# Patient Record
Sex: Male | Born: 1965 | Hispanic: No | Marital: Married | Smoking: Former smoker
Health system: Southern US, Community
[De-identification: ages and names within clinical notes are randomized; demographics above are authoritative.]

## PROBLEM LIST (undated history)

## (undated) DIAGNOSIS — E1165 Type 2 diabetes mellitus with hyperglycemia: Secondary | ICD-10-CM

## (undated) DIAGNOSIS — E669 Obesity, unspecified: Secondary | ICD-10-CM

## (undated) DIAGNOSIS — E781 Pure hyperglyceridemia: Secondary | ICD-10-CM

## (undated) DIAGNOSIS — I4891 Unspecified atrial fibrillation: Secondary | ICD-10-CM

## (undated) DIAGNOSIS — R7303 Prediabetes: Secondary | ICD-10-CM

## (undated) DIAGNOSIS — K760 Fatty (change of) liver, not elsewhere classified: Secondary | ICD-10-CM

## (undated) DIAGNOSIS — F419 Anxiety disorder, unspecified: Secondary | ICD-10-CM

## (undated) DIAGNOSIS — I201 Angina pectoris with documented spasm: Secondary | ICD-10-CM

## (undated) DIAGNOSIS — IMO0002 Reserved for concepts with insufficient information to code with codable children: Secondary | ICD-10-CM

## (undated) DIAGNOSIS — E559 Vitamin D deficiency, unspecified: Secondary | ICD-10-CM

## (undated) DIAGNOSIS — F329 Major depressive disorder, single episode, unspecified: Secondary | ICD-10-CM

## (undated) DIAGNOSIS — K589 Irritable bowel syndrome without diarrhea: Secondary | ICD-10-CM

## (undated) DIAGNOSIS — R1032 Left lower quadrant pain: Secondary | ICD-10-CM

## (undated) DIAGNOSIS — R1013 Epigastric pain: Secondary | ICD-10-CM

## (undated) DIAGNOSIS — R1312 Dysphagia, oropharyngeal phase: Secondary | ICD-10-CM

## (undated) DIAGNOSIS — E7800 Pure hypercholesterolemia, unspecified: Secondary | ICD-10-CM

## (undated) DIAGNOSIS — K219 Gastro-esophageal reflux disease without esophagitis: Secondary | ICD-10-CM

## (undated) DIAGNOSIS — E291 Testicular hypofunction: Secondary | ICD-10-CM

## (undated) DIAGNOSIS — F32A Depression, unspecified: Secondary | ICD-10-CM

## (undated) DIAGNOSIS — E785 Hyperlipidemia, unspecified: Secondary | ICD-10-CM

## (undated) DIAGNOSIS — R079 Chest pain, unspecified: Secondary | ICD-10-CM

## (undated) DIAGNOSIS — F5104 Psychophysiologic insomnia: Secondary | ICD-10-CM

## (undated) HISTORY — DX: Fatty (change of) liver, not elsewhere classified: K76.0

## (undated) HISTORY — DX: Depression, unspecified: F32.A

## (undated) HISTORY — DX: Epigastric pain: R10.13

## (undated) HISTORY — DX: Irritable bowel syndrome, unspecified: K58.9

## (undated) HISTORY — DX: Type 2 diabetes mellitus with hyperglycemia: E11.65

## (undated) HISTORY — DX: Angina pectoris with documented spasm: I20.1

## (undated) HISTORY — DX: Psychophysiologic insomnia: F51.04

## (undated) HISTORY — DX: Pure hypercholesterolemia, unspecified: E78.00

## (undated) HISTORY — DX: Reserved for concepts with insufficient information to code with codable children: IMO0002

## (undated) HISTORY — DX: Major depressive disorder, single episode, unspecified: F32.9

## (undated) HISTORY — DX: Left lower quadrant pain: R10.32

## (undated) HISTORY — DX: Dysphagia, oropharyngeal phase: R13.12

## (undated) HISTORY — DX: Vitamin D deficiency, unspecified: E55.9

## (undated) HISTORY — PX: KNEE SURGERY: SHX244

## (undated) HISTORY — DX: Testicular hypofunction: E29.1

---

## 1983-03-21 HISTORY — PX: APPENDECTOMY: SHX54

## 1988-03-20 HISTORY — PX: EXTERNAL EAR SURGERY: SHX627

## 2001-07-18 HISTORY — PX: COLONOSCOPY: SHX174

## 2004-03-20 HISTORY — PX: UPPER GI ENDOSCOPY: SHX6162

## 2004-12-15 ENCOUNTER — Encounter: Admission: RE | Admit: 2004-12-15 | Discharge: 2004-12-15 | Payer: Self-pay | Admitting: Unknown Physician Specialty

## 2005-04-22 ENCOUNTER — Encounter: Admission: RE | Admit: 2005-04-22 | Discharge: 2005-04-22 | Payer: Self-pay | Admitting: Unknown Physician Specialty

## 2005-04-30 ENCOUNTER — Encounter: Admission: RE | Admit: 2005-04-30 | Discharge: 2005-04-30 | Payer: Self-pay | Admitting: Unknown Physician Specialty

## 2005-07-18 HISTORY — PX: COLONOSCOPY: SHX174

## 2006-04-24 HISTORY — PX: HEMORROIDECTOMY: SUR656

## 2008-03-20 HISTORY — PX: KNEE SURGERY: SHX244

## 2009-03-02 ENCOUNTER — Observation Stay (HOSPITAL_COMMUNITY): Admission: EM | Admit: 2009-03-02 | Discharge: 2009-03-05 | Payer: Self-pay | Admitting: Emergency Medicine

## 2009-11-18 ENCOUNTER — Encounter: Admission: RE | Admit: 2009-11-18 | Discharge: 2009-11-18 | Payer: Self-pay | Admitting: Orthopedic Surgery

## 2010-04-09 ENCOUNTER — Encounter: Payer: Self-pay | Admitting: Unknown Physician Specialty

## 2010-04-10 ENCOUNTER — Encounter: Payer: Self-pay | Admitting: Orthopedic Surgery

## 2010-06-20 LAB — BASIC METABOLIC PANEL
CO2: 29 mEq/L (ref 19–32)
Calcium: 8.9 mg/dL (ref 8.4–10.5)
Creatinine, Ser: 0.91 mg/dL (ref 0.4–1.5)

## 2010-06-21 LAB — CBC
MCHC: 35.1 g/dL (ref 30.0–36.0)
Platelets: 177 10*3/uL (ref 150–400)
RBC: 5.15 MIL/uL (ref 4.22–5.81)
RDW: 14.7 % (ref 11.5–15.5)

## 2010-06-21 LAB — URINALYSIS, ROUTINE W REFLEX MICROSCOPIC
Bilirubin Urine: NEGATIVE
Hgb urine dipstick: NEGATIVE
Ketones, ur: NEGATIVE mg/dL
Protein, ur: NEGATIVE mg/dL
Urobilinogen, UA: 1 mg/dL (ref 0.0–1.0)

## 2010-06-21 LAB — CK TOTAL AND CKMB (NOT AT ARMC)
CK, MB: 1.9 ng/mL (ref 0.3–4.0)
CK, MB: 2.6 ng/mL (ref 0.3–4.0)
Total CK: 211 U/L (ref 7–232)
Total CK: 217 U/L (ref 7–232)
Total CK: 252 U/L — ABNORMAL HIGH (ref 7–232)

## 2010-06-21 LAB — DIFFERENTIAL
Basophils Absolute: 0.1 10*3/uL (ref 0.0–0.1)
Basophils Relative: 1 % (ref 0–1)
Eosinophils Absolute: 0.3 10*3/uL (ref 0.0–0.7)
Eosinophils Relative: 3 % (ref 0–5)
Monocytes Absolute: 0.7 10*3/uL (ref 0.1–1.0)
Neutro Abs: 5.9 10*3/uL (ref 1.7–7.7)

## 2010-06-21 LAB — LIPID PANEL
HDL: 35 mg/dL — ABNORMAL LOW (ref >39)
Total CHOL/HDL Ratio: 5.1 RATIO
VLDL: 60 mg/dL — ABNORMAL HIGH (ref 0–40)

## 2010-06-21 LAB — BASIC METABOLIC PANEL
Calcium: 8.9 mg/dL (ref 8.4–10.5)
GFR calc non Af Amer: >60 mL/min (ref >60)
Glucose, Bld: 105 mg/dL — ABNORMAL HIGH (ref 70–99)
Sodium: 133 mEq/L — ABNORMAL LOW (ref 135–145)

## 2010-06-21 LAB — POCT CARDIAC MARKERS
CKMB, poc: 1.2 ng/mL (ref 1.0–8.0)
Myoglobin, poc: 108 ng/mL (ref 12–200)

## 2013-06-07 ENCOUNTER — Inpatient Hospital Stay (HOSPITAL_COMMUNITY)
Admission: EM | Admit: 2013-06-07 | Discharge: 2013-06-09 | DRG: 311 | Disposition: A | Discharge status: Home / Self Care | Payer: BC Managed Care – PPO | Attending: Internal Medicine | Admitting: Internal Medicine

## 2013-06-07 ENCOUNTER — Encounter (HOSPITAL_COMMUNITY): Payer: Self-pay | Admitting: Emergency Medicine

## 2013-06-07 ENCOUNTER — Emergency Department (HOSPITAL_COMMUNITY): Payer: BC Managed Care – PPO

## 2013-06-07 DIAGNOSIS — I4729 Other ventricular tachycardia: Secondary | ICD-10-CM | POA: Diagnosis not present

## 2013-06-07 DIAGNOSIS — E782 Mixed hyperlipidemia: Secondary | ICD-10-CM

## 2013-06-07 DIAGNOSIS — K219 Gastro-esophageal reflux disease without esophagitis: Secondary | ICD-10-CM | POA: Diagnosis present

## 2013-06-07 DIAGNOSIS — Z8249 Family history of ischemic heart disease and other diseases of the circulatory system: Secondary | ICD-10-CM

## 2013-06-07 DIAGNOSIS — Z87891 Personal history of nicotine dependence: Secondary | ICD-10-CM

## 2013-06-07 DIAGNOSIS — I472 Ventricular tachycardia, unspecified: Secondary | ICD-10-CM | POA: Diagnosis not present

## 2013-06-07 DIAGNOSIS — E119 Type 2 diabetes mellitus without complications: Secondary | ICD-10-CM | POA: Diagnosis present

## 2013-06-07 DIAGNOSIS — E781 Pure hyperglyceridemia: Secondary | ICD-10-CM | POA: Diagnosis present

## 2013-06-07 DIAGNOSIS — E8881 Metabolic syndrome: Secondary | ICD-10-CM | POA: Diagnosis present

## 2013-06-07 DIAGNOSIS — R079 Chest pain, unspecified: Secondary | ICD-10-CM

## 2013-06-07 DIAGNOSIS — R7309 Other abnormal glucose: Secondary | ICD-10-CM

## 2013-06-07 DIAGNOSIS — Z7982 Long term (current) use of aspirin: Secondary | ICD-10-CM

## 2013-06-07 DIAGNOSIS — E669 Obesity, unspecified: Secondary | ICD-10-CM | POA: Diagnosis present

## 2013-06-07 DIAGNOSIS — I201 Angina pectoris with documented spasm: Principal | ICD-10-CM | POA: Diagnosis present

## 2013-06-07 DIAGNOSIS — Z6835 Body mass index (BMI) 35.0-35.9, adult: Secondary | ICD-10-CM

## 2013-06-07 DIAGNOSIS — E7800 Pure hypercholesterolemia, unspecified: Secondary | ICD-10-CM | POA: Diagnosis present

## 2013-06-07 DIAGNOSIS — F411 Generalized anxiety disorder: Secondary | ICD-10-CM | POA: Diagnosis present

## 2013-06-07 DIAGNOSIS — E785 Hyperlipidemia, unspecified: Secondary | ICD-10-CM | POA: Diagnosis present

## 2013-06-07 DIAGNOSIS — I2510 Atherosclerotic heart disease of native coronary artery without angina pectoris: Secondary | ICD-10-CM | POA: Diagnosis present

## 2013-06-07 HISTORY — DX: Unspecified atrial fibrillation: I48.91

## 2013-06-07 HISTORY — DX: Chest pain, unspecified: R07.9

## 2013-06-07 HISTORY — DX: Obesity, unspecified: E66.9

## 2013-06-07 HISTORY — DX: Pure hyperglyceridemia: E78.1

## 2013-06-07 HISTORY — DX: Gastro-esophageal reflux disease without esophagitis: K21.9

## 2013-06-07 HISTORY — DX: Anxiety disorder, unspecified: F41.9

## 2013-06-07 HISTORY — DX: Prediabetes: R73.03

## 2013-06-07 HISTORY — DX: Angina pectoris with documented spasm: I20.1

## 2013-06-07 HISTORY — DX: Hyperlipidemia, unspecified: E78.5

## 2013-06-07 LAB — I-STAT CHEM 8, ED
BUN: 10 mg/dL (ref 6–23)
CALCIUM ION: 1.11 mmol/L — AB (ref 1.12–1.23)
CHLORIDE: 100 meq/L (ref 96–112)
Creatinine, Ser: 1.1 mg/dL (ref 0.50–1.35)
Glucose, Bld: 111 mg/dL — ABNORMAL HIGH (ref 70–99)
HEMATOCRIT: 47 % (ref 39.0–52.0)
Hemoglobin: 16 g/dL (ref 13.0–17.0)
Potassium: 4.3 mEq/L (ref 3.7–5.3)
SODIUM: 139 meq/L (ref 137–147)
TCO2: 28 mmol/L (ref 0–100)

## 2013-06-07 LAB — MRSA PCR SCREENING: MRSA by PCR: NEGATIVE

## 2013-06-07 LAB — CBC
HEMATOCRIT: 43.9 % (ref 39.0–52.0)
HEMATOCRIT: 40.5 % (ref 39.0–52.0)
HEMOGLOBIN: 14.3 g/dL (ref 13.0–17.0)
Hemoglobin: 15.3 g/dL (ref 13.0–17.0)
MCH: 30.5 pg (ref 26.0–34.0)
MCH: 30.8 pg (ref 26.0–34.0)
MCHC: 34.9 g/dL (ref 30.0–36.0)
MCHC: 35.3 g/dL (ref 30.0–36.0)
MCV: 87.5 fL (ref 78.0–100.0)
MCV: 87.3 fL (ref 78.0–100.0)
PLATELETS: 184 10*3/uL (ref 150–400)
PLATELETS: 166 10*3/uL (ref 150–400)
RBC: 5.02 MIL/uL (ref 4.22–5.81)
RBC: 4.64 MIL/uL (ref 4.22–5.81)
RDW: 13.4 % (ref 11.5–15.5)
RDW: 13.2 % (ref 11.5–15.5)
WBC: 8.5 10*3/uL (ref 4.0–10.5)
WBC: 8.3 10*3/uL (ref 4.0–10.5)

## 2013-06-07 LAB — I-STAT TROPONIN, ED
TROPONIN I, POC: 0 ng/mL (ref 0.00–0.08)
Troponin i, poc: 0 ng/mL (ref 0.00–0.08)

## 2013-06-07 LAB — COMPREHENSIVE METABOLIC PANEL
ALT: 35 U/L (ref 0–53)
AST: 35 U/L (ref 0–37)
Albumin: 3.6 g/dL (ref 3.5–5.2)
Alkaline Phosphatase: 71 U/L (ref 39–117)
BILIRUBIN TOTAL: 0.3 mg/dL (ref 0.3–1.2)
BUN: 13 mg/dL (ref 6–23)
CO2: 22 meq/L (ref 19–32)
CREATININE: 0.78 mg/dL (ref 0.50–1.35)
Calcium: 8.6 mg/dL (ref 8.4–10.5)
Chloride: 94 mEq/L — ABNORMAL LOW (ref 96–112)
GFR calc Af Amer: >90 mL/min (ref >90)
GLUCOSE: 183 mg/dL — AB (ref 70–99)
Potassium: 4.3 mEq/L (ref 3.7–5.3)
Sodium: 133 mEq/L — ABNORMAL LOW (ref 137–147)
Total Protein: 7 g/dL (ref 6.0–8.3)

## 2013-06-07 LAB — TSH: TSH: 2.558 u[IU]/mL (ref 0.350–4.500)

## 2013-06-07 LAB — RAPID URINE DRUG SCREEN, HOSP PERFORMED
Amphetamines: NOT DETECTED
BENZODIAZEPINES: NOT DETECTED
Barbiturates: NOT DETECTED
Cocaine: NOT DETECTED
OPIATES: POSITIVE — AB
Tetrahydrocannabinol: NOT DETECTED

## 2013-06-07 LAB — TROPONIN I: Troponin I: <0.3 ng/mL (ref <0.30)

## 2013-06-07 LAB — CREATININE, SERUM
Creatinine, Ser: 0.76 mg/dL (ref 0.50–1.35)
GFR calc Af Amer: >90 mL/min (ref >90)
GFR calc non Af Amer: >90 mL/min (ref >90)

## 2013-06-07 MED ORDER — NITROGLYCERIN 0.4 MG SL SUBL
0.4000 mg | SUBLINGUAL_TABLET | SUBLINGUAL | Status: DC | PRN
  Administered 2013-06-07 (×2): 0.4 mg via SUBLINGUAL
  Filled 2013-06-07: qty 1

## 2013-06-07 MED ORDER — SODIUM CHLORIDE 0.9 % IJ SOLN
3.0000 mL | Freq: Two times a day (BID) | INTRAMUSCULAR | Status: DC
  Administered 2013-06-07 – 2013-06-09 (×4): 3 mL via INTRAVENOUS

## 2013-06-07 MED ORDER — HEPARIN SODIUM (PORCINE) 5000 UNIT/ML IJ SOLN
5000.0000 [IU] | Freq: Three times a day (TID) | INTRAMUSCULAR | Status: DC
  Administered 2013-06-07: 5000 [IU] via SUBCUTANEOUS
  Filled 2013-06-07 (×3): qty 1

## 2013-06-07 MED ORDER — ESCITALOPRAM OXALATE 20 MG PO TABS
20.0000 mg | ORAL_TABLET | Freq: Every day | ORAL | Status: DC
  Administered 2013-06-07 – 2013-06-09 (×3): 20 mg via ORAL
  Filled 2013-06-07 (×3): qty 1

## 2013-06-07 MED ORDER — ASPIRIN 81 MG PO CHEW: 324.0000 mg | CHEWABLE_TABLET | ORAL | Status: DC

## 2013-06-07 MED ORDER — ASPIRIN 81 MG PO CHEW
324.0000 mg | CHEWABLE_TABLET | Freq: Once | ORAL | Status: AC
  Administered 2013-06-07: 324 mg via ORAL
  Filled 2013-06-07: qty 4

## 2013-06-07 MED ORDER — NITROGLYCERIN 0.4 MG SL SUBL: 0.4000 mg | SUBLINGUAL_TABLET | SUBLINGUAL | Status: DC | PRN

## 2013-06-07 MED ORDER — ASPIRIN 81 MG PO CHEW
81.0000 mg | CHEWABLE_TABLET | Freq: Every day | ORAL | Status: DC
  Administered 2013-06-08 – 2013-06-09 (×2): 81 mg via ORAL
  Filled 2013-06-07 (×2): qty 1

## 2013-06-07 MED ORDER — MORPHINE SULFATE 4 MG/ML IJ SOLN
4.0000 mg | Freq: Once | INTRAMUSCULAR | Status: AC
  Administered 2013-06-07: 4 mg via INTRAVENOUS
  Filled 2013-06-07: qty 1

## 2013-06-07 MED ORDER — SODIUM CHLORIDE 0.9 % IV SOLN: 250.0000 mL | INTRAVENOUS | Status: DC | PRN

## 2013-06-07 MED ORDER — AMLODIPINE BESYLATE 2.5 MG PO TABS
2.5000 mg | ORAL_TABLET | Freq: Every day | ORAL | Status: DC
  Administered 2013-06-07 – 2013-06-08 (×2): 2.5 mg via ORAL
  Filled 2013-06-07 (×2): qty 1

## 2013-06-07 MED ORDER — ONDANSETRON HCL 4 MG/2ML IJ SOLN
4.0000 mg | Freq: Once | INTRAMUSCULAR | Status: AC
  Administered 2013-06-07: 4 mg via INTRAVENOUS
  Filled 2013-06-07: qty 2

## 2013-06-07 MED ORDER — SODIUM CHLORIDE 0.9 % IJ SOLN
3.0000 mL | INTRAMUSCULAR | Status: DC | PRN
  Administered 2013-06-07: 3 mL via INTRAVENOUS

## 2013-06-07 MED ORDER — ADULT MULTIVITAMIN W/MINERALS CH
1.0000 | ORAL_TABLET | Freq: Every day | ORAL | Status: DC
  Administered 2013-06-07 – 2013-06-09 (×3): 1 via ORAL
  Filled 2013-06-07 (×3): qty 1

## 2013-06-07 MED ORDER — ASPIRIN 81 MG PO CHEW: 81.0000 mg | CHEWABLE_TABLET | Freq: Once | ORAL | Status: DC

## 2013-06-07 MED ORDER — ALPRAZOLAM 0.25 MG PO TABS
0.2500 mg | ORAL_TABLET | ORAL | Status: DC | PRN
  Administered 2013-06-08 – 2013-06-09 (×3): 0.25 mg via ORAL
  Filled 2013-06-07 (×3): qty 1

## 2013-06-07 MED ORDER — ATORVASTATIN CALCIUM 20 MG PO TABS
20.0000 mg | ORAL_TABLET | Freq: Every day | ORAL | Status: DC
  Administered 2013-06-07 – 2013-06-08 (×2): 20 mg via ORAL
  Filled 2013-06-07 (×5): qty 1

## 2013-06-07 MED ORDER — ASPIRIN 81 MG PO CHEW: 324.0000 mg | CHEWABLE_TABLET | Freq: Every day | ORAL | Status: DC

## 2013-06-07 MED ORDER — MORPHINE SULFATE 2 MG/ML IJ SOLN
1.0000 mg | INTRAMUSCULAR | Status: DC | PRN
  Administered 2013-06-07 – 2013-06-08 (×4): 1 mg via INTRAVENOUS
  Filled 2013-06-07 (×4): qty 1

## 2013-06-07 MED ORDER — PANTOPRAZOLE SODIUM 40 MG PO TBEC
40.0000 mg | DELAYED_RELEASE_TABLET | Freq: Every day | ORAL | Status: DC
  Administered 2013-06-07 – 2013-06-09 (×3): 40 mg via ORAL
  Filled 2013-06-07 (×3): qty 1

## 2013-06-07 MED ORDER — HEPARIN SODIUM (PORCINE) 5000 UNIT/ML IJ SOLN
5000.0000 [IU] | Freq: Three times a day (TID) | INTRAMUSCULAR | Status: DC
  Administered 2013-06-07 – 2013-06-09 (×5): 5000 [IU] via SUBCUTANEOUS
  Filled 2013-06-07 (×8): qty 1

## 2013-06-07 MED ORDER — ASPIRIN 300 MG RE SUPP: 300.0000 mg | RECTAL | Status: DC

## 2013-06-07 NOTE — ED Notes (Signed)
C/o sharp L sided chest pain x 3 hours with sob, nausea, and radiation to L shoulder.

## 2013-06-07 NOTE — ED Notes (Signed)
Pt sleeping intermittently.  C/o chest pain

## 2013-06-07 NOTE — H&P (Signed)
Date: 06/07/2013               Patient Name:  Dwayne Silva MRN: [id number]  DOB: November 26, 1965 Age / Sex: 48 y.o., male   PCP: Angelina Sheriff, MD         Medical Service: Internal Medicine Teaching Service         Attending Physician: Dr. Bartholomew Crews, MD    First Contact: Dr. Lesly Dukes Pager: [phone]  Second Contact: Dr. Randell Loop Pager: [phone]       After Hours (After 5p/  First Contact Pager: [phone]  weekends / holidays): Second Contact Pager: [phone]   Chief Complaint: Chest pain  History of Present Illness:  [name] is a 48 y.o. [place] male PMH non-obstructive CAD, GERD, anxiety, pre-diabetes, who presents to the ED with a chief complaint of chest pain.  Patient reports his symptoms started yesterday afternoon around 5 or 6 PM. He developed left-sided chest pain with associated back, shoulder, and neck discomfort. It is constant. It is not pleuritic. It is associated with diaphoresis and nausea. He had similar symptoms in November for which he was admitted to the hospital in [place], [place].   We were able to obtain records from [facility]. Patient presented to [facility] with chest pain, neck pain, and left arm discomfort associated with palpitations in November 2014. At time of that admission, patient was noted to be in A. fib with RVR, rate 160-170s. He was placed on a diltiazem drip with improved heart rates and spontaneously cardioverted back to sinus rhythm. EKG was without significant ST segment changes. A CT pulmonary arteriogram was obtained, which was negative for pulmonary emboli, aortic dissection, or aneurysm. Troponins were trended and negative x3. Lexiscan Cardiolite stress test was performed and negative - there were no fixed or reversible perfusion defects to suggest infarct or ischemia, wall motion was normal and calculated EF was 61%. 2D echo was also performed. It was unremarkable, EF greater than 36%, normal  diastolic function. As his CHADS-VASC was recorded as 0, he was not placed on long-term anticoagulation at discharge. He was started on antiplatelet therapy therapy with aspirin 325.  He has a history of heart catheterizations x2 at [facility]. Records were obtained. First LHC in 07/2007 which revealed right dominant anatomy, normal coronary arteries, and normal EF. Second LHC was in 08/2010 and notable for moderate, diffuse coronary vasospasm of the distal RCA and RPL that resolved completely with NG; mild, nonobstructive CAD (40% stenosis of LCx); normal EF 55%. Recommendations were to start amlodipine to treat vasospastic angina and continue primary CAD prevention.   He is a prior smoker, one pack per day for many years. He drinks alcohol occasionally. Denies illicit drug use. He works as a Training and development officer in [employer]. He has no recent travel, but did fly to [place] in September. No history of blood clots. He has a family history of heart disease in his mother, she is still alive at age 87 and was diagnosed at age 54.  In the ED he was given aspirin 325 mg, morphine 4 mg IV, Zofran 4 mg IV. He was admitted to the step down unit due to ongoing chest pain.  Meds:  Current Outpatient Prescriptions  Lipitor 10 qhs Lexapro 20 qd Xanax 0.25 qhs prn ASA 325 qd  Allergies: Allergies as of 06/07/2013  . (No Known Allergies)   Past Medical History  Diagnosis Date  . Acid reflux   .  Anxiety    History reviewed. No pertinent past surgical history. No family history on file. History   Social History  . Marital Status: Married    Spouse Name: N/A    Number of Children: N/A  . Years of Education: N/A   Occupational History  . Not on file.   Social History Main Topics  . Smoking status: Former Research scientist (life sciences)  . Smokeless tobacco: Not on file  . Alcohol Use: Yes  . Drug Use: No  . Sexual Activity: Not on file   Other Topics Concern  . Not on file   Social History Narrative  . No narrative on  file    Review of Systems: Pertinent items are noted in HPI.  Physical Exam: Blood pressure 136/85, pulse 71, temperature 98.1 F (36.7 C), temperature source Oral, resp. rate 15, height 5\' 9"  (1.753 m), weight 241 lb (109.317 kg), SpO2 96.00%. Physical Exam  Constitutional: He is oriented to person, place, and time and well-developed, well-nourished, and in no distress.  HENT:  Head: Normocephalic and atraumatic.  Eyes: Conjunctivae and EOM are normal. Pupils are equal, round, and reactive to light.  Neck: Normal range of motion. Neck supple. No JVD present.  Cardiovascular: Normal rate, regular rhythm, normal heart sounds and intact distal pulses.  Exam reveals no gallop and no friction rub.   No murmur heard. Pulmonary/Chest: Effort normal and breath sounds normal. No respiratory distress. He has no wheezes. He has no rales. He exhibits no tenderness.  Abdominal: Soft. Bowel sounds are normal. He exhibits no distension. There is no tenderness.  Musculoskeletal: Normal range of motion. He exhibits no edema and no tenderness.  Neurological: He is alert and oriented to person, place, and time. No cranial nerve deficit. GCS score is 15.  Skin: Skin is warm. He is diaphoretic.  Psychiatric: Affect normal.     Lab results: Basic Metabolic Panel:  Recent Labs  06/07/13 0145  NA 139  K 4.3  CL 100  GLUCOSE 111*  BUN 10  CREATININE 1.10   Liver Function Tests: No results found for this basename: AST, ALT, ALKPHOS, BILITOT, PROT, ALBUMIN,  in the last 72 hours No results found for this basename: LIPASE, AMYLASE,  in the last 72 hours No results found for this basename: AMMONIA,  in the last 72 hours CBC:  Recent Labs  06/07/13 0140 06/07/13 0145  WBC 8.5  --   HGB 15.3 16.0  HCT 43.9 47.0  MCV 87.5  --   PLT 184  --    Cardiac Enzymes: No results found for this basename: CKTOTAL, CKMB, CKMBINDEX, TROPONINI,  in the last 72 hours BNP: No results found for this  basename: PROBNP,  in the last 72 hours D-Dimer: No results found for this basename: DDIMER,  in the last 72 hours CBG: No results found for this basename: GLUCAP,  in the last 72 hours Hemoglobin A1C: No results found for this basename: HGBA1C,  in the last 72 hours Fasting Lipid Panel: No results found for this basename: CHOL, HDL, LDLCALC, TRIG, CHOLHDL, LDLDIRECT,  in the last 72 hours Thyroid Function Tests: No results found for this basename: TSH, T4TOTAL, FREET4, T3FREE, THYROIDAB,  in the last 72 hours Anemia Panel: No results found for this basename: VITAMINB12, FOLATE, FERRITIN, TIBC, IRON, RETICCTPCT,  in the last 72 hours Coagulation: No results found for this basename: LABPROT, INR,  in the last 72 hours Urine Drug Screen: Drugs of Abuse  No results found for this  basename: labopia,  cocainscrnur,  labbenz,  amphetmu,  thcu,  labbarb    Alcohol Level: No results found for this basename: ETH,  in the last 72 hours Urinalysis: No results found for this basename: COLORURINE, APPERANCEUR, LABSPEC, Hart, GLUCOSEU, HGBUR, BILIRUBINUR, KETONESUR, PROTEINUR, UROBILINOGEN, NITRITE, LEUKOCYTESUR,  in the last 72 hours   Imaging results:  Dg Chest 2 View  06/07/2013   CLINICAL DATA:  CHEST PAIN  EXAM: CHEST  2 VIEW  COMPARISON:  DG CHEST PORTABLE dated 02/07/2013  FINDINGS: Normal mediastinum and cardiac silhouette. Normal pulmonary vasculature. No evidence of effusion, infiltrate, or pneumothorax. No acute bony abnormality.  IMPRESSION: No acute cardiopulmonary process.   Electronically Signed   By: Suzy Bouchard M.D.   On: 06/07/2013 02:10    Other results: EKG: There are no previous tracings available for comparison, normal sinus rhythm, rate 74, left axis deviation, no ST elevation or depression and no T wave inversions, but perhaps some T wave flattening in lead III. We have an EKG report (but not the actual EKG) from his hospitalization in 11/14 that reads: atrial  fibrillation with rapid ventricular response, rate 156, nonspecific ST abnormality   Assessment & Plan by Problem: Tarvis Provencio is a 48 y.o. [place] male PMH non-obstructive CAD, vasospastic angina, GERD, anxiety, pre-diabetes, recent hospitalization for new onset AFib with RVR and chest pain rule out in 11/14, not on anticoagulation, who presents to the ED with a chief complaint of chest pain.  #Chest pain, rule out MI - Patient presents with left-sided chest pain associated with left shoulder, back, and neck discomfort concerning for ACS. He had similar symptoms in 11/14 associated with an episode of AFib with RVR. He spontaneously converted to NSR during that hospitalization. ACS rule out was negative at that time and included serial troponins, 2D echo, and a nuclear stress test. He is in NSR today. Cath in 08/2010 was nonobstructive but showed evidence of vasospastic angina. Patient was to start a calcium channel blocker (amlodipine) for this, but it is not on his medication list. Interestingly, per UTD, aspirin should be used with caution, since at high doses it is an inhibitor of prostacyclin production. He takes 325 at home. TIMI score 1, 5% risk at 14 days of: all-cause mortality, new or recurrent MI, or severe recurrent ischemia requiring urgent revascularization. Wells 0, do not suspect PE. CXR shows no acute abnormality, no mediastinal widening, no pneumonia. He is diaphoretic on exam, but otherwise no concerning clinical exam findings. - Admit to IMTS, SDU given active chest pain  - Starting amlodipine 2.5mg , will titrate up as blood pressure tolerates - Cardiac monitoring - ASA 81 daily - Trending troponins, point of care negative x2 - Morphine 1mg  IV q4h prn pain - No scheduled SL NG given normotensive and getting amlodipine plus morphine, but if called will give SL NG as abortive therapy - Heart healthy diet - Lipid panel - UDS to rule out cocaine - BMP, CBC in am - Repeat  EKG in am  #Pre-diabetes - A1C 6.2 in 11/14. - Repeat A1C  #History of AFib with RVR - Currently in NSR. CHADS-VASC was 0 in 11/14 when episode occurred. He takes ASA daily. - TSH - Telemetry  #Anxiety - Continue home Xanax 0.25mg  prn, Lexapro 20mg  daily  #HLD - Continue Lipitor 20mg  daily  #GERD - Protonix 40mg  daily  #DVT PPX - Subq heparin  Dispo: Disposition is deferred at this time, awaiting improvement of current medical problems. Anticipated discharge  in approximately 1-3 day(s).   The patient does have a current PCP Jacqlyn Larsen II, MD) and does not need an [facility] hospital follow-up appointment after discharge.  The patient does not have transportation limitations that hinder transportation to clinic appointments.  Signed: Lesly Dukes, MD 06/07/2013, 8:34 AM   Lesly Dukes, MD  [email or web address] Pager # [phone] Office # [phone]

## 2013-06-07 NOTE — ED Notes (Signed)
Romie Minus (wife)- [phone]

## 2013-06-07 NOTE — ED Notes (Signed)
The pt is c/o lt upper chest pain lt shoulder and neck pain for 5 hours..  The pain is still there.  Alert skin warm and dry.  No distress

## 2013-06-07 NOTE — ED Provider Notes (Signed)
CSN: [id number]     Arrival date & time 06/07/13  0126 History   First MD Initiated Contact with Patient 06/07/13 [phone]     Chief Complaint  Patient presents with  . Chest Pain     (Consider location/radiation/quality/duration/timing/severity/associated sxs/prior Treatment) HPI History provided by patient. Left-sided chest pain onset tonight at home. Pain pressure like, radiates to left shoulder. Has associated shortness of breath, diaphoresis. He also complains of some nausea but no vomiting. No known aggravating or alleviating factors. No fevers or chills. No associated back pain. No leg pain or leg swelling. Denies history of same.   Past Medical History  Diagnosis Date  . Acid reflux   . Anxiety    History reviewed. No pertinent past surgical history. No family history on file. History  Substance Use Topics  . Smoking status: Former Research scientist (life sciences)  . Smokeless tobacco: Not on file  . Alcohol Use: Yes    Review of Systems  Constitutional: Negative for fever and chills.  Respiratory: Positive for shortness of breath.   Cardiovascular: Positive for chest pain.  Gastrointestinal: Negative for abdominal pain.  Musculoskeletal: Negative for back pain, neck pain and neck stiffness.  Skin: Negative for rash.  Neurological: Negative for headaches.  All other systems reviewed and are negative.      Allergies  Review of patient's allergies indicates not on file.  Home Medications   Current Outpatient Rx  Name  Route  Sig  Dispense  Refill  . ALPRAZolam (XANAX) 0.25 MG tablet               . atorvastatin (LIPITOR) 10 MG tablet               . escitalopram (LEXAPRO) 20 MG tablet                BP 115/68  Pulse 78  Temp(Src) 98.1 F (36.7 C) (Oral)  Resp 18  Ht 5\' 9"  (1.753 m)  Wt 241 lb (109.317 kg)  BMI 35.57 kg/m2  SpO2 97% Physical Exam  Constitutional: He is oriented to person, place, and time. He appears well-developed and well-nourished.  HENT:   Head: Normocephalic and atraumatic.  Eyes: EOM are normal. Pupils are equal, round, and reactive to light.  Neck: Neck supple.  Cardiovascular: Normal rate, regular rhythm and intact distal pulses.   Pulmonary/Chest: Effort normal. No respiratory distress. He exhibits no tenderness.  Abdominal: Soft. He exhibits no distension. There is no tenderness.  Musculoskeletal: Normal range of motion. He exhibits no edema.  Neurological: He is alert and oriented to person, place, and time.  Skin: Skin is warm and dry.    ED Course  Procedures (including critical care time) Labs Review Labs Reviewed  I-STAT CHEM 8, ED - Abnormal; Notable for the following:    Glucose, Bld 111 (*)    Calcium, Ion 1.11 (*)    All other components within normal limits  CBC  I-STAT TROPOININ, ED  Randolm Idol, ED   Imaging Review Dg Chest 2 View  06/07/2013   CLINICAL DATA:  CHEST PAIN  EXAM: CHEST  2 VIEW  COMPARISON:  DG CHEST PORTABLE dated 02/07/2013  FINDINGS: Normal mediastinum and cardiac silhouette. Normal pulmonary vasculature. No evidence of effusion, infiltrate, or pneumothorax. No acute bony abnormality.  IMPRESSION: No acute cardiopulmonary process.   Electronically Signed   By: Suzy Bouchard M.D.   On: 06/07/2013 02:10     EKG Interpretation   Date/Time:  Saturday June 07 2013  01:29:23 EDT Ventricular Rate:  82 PR Interval:  128 QRS Duration: 84 QT Interval:  370 QTC Calculation: 432 R Axis:   -15 Text Interpretation:  Normal sinus rhythm Nonspecific ST abnormality No  old tracing to compare Confirmed by Romulus Hanrahan  MD, Katarzyna Wolven ([id number]) on 06/07/2013  1:32:29 AM     Aspirin, nitroglycerin, IV morphine provided  Repeat ECG unchanged  7:09 AM d/w MED, plan admit MDM   Dx: Chest pain  48 y/o male with CP radiating to his shoulder, reports diaphoresis and nausea, no known cardiac history  ECG, CXR, labs obtained/ reviewed Medications provided MED admit    Teressa Lower,  MD 06/07/13 [phone]

## 2013-06-07 NOTE — Plan of Care (Signed)
Problem: Consults Goal: Chest Pain Patient Education (See Patient Education module for education specifics.) Outcome: Progressing Denies pain but chest pressure present--does not increase w/activity

## 2013-06-07 NOTE — Progress Notes (Signed)
Pt c/o 8/10 chest pain; pt sweating behind his head; no clamminess noted on extremities or chest area; pt states feels a little nauseated; Morphine 1mg  given for pain; will continue to assess

## 2013-06-08 ENCOUNTER — Encounter (HOSPITAL_COMMUNITY): Payer: Self-pay | Admitting: Physician Assistant

## 2013-06-08 DIAGNOSIS — R002 Palpitations: Secondary | ICD-10-CM

## 2013-06-08 DIAGNOSIS — R079 Chest pain, unspecified: Secondary | ICD-10-CM

## 2013-06-08 DIAGNOSIS — E785 Hyperlipidemia, unspecified: Secondary | ICD-10-CM

## 2013-06-08 DIAGNOSIS — E781 Pure hyperglyceridemia: Secondary | ICD-10-CM

## 2013-06-08 LAB — BASIC METABOLIC PANEL
BUN: 14 mg/dL (ref 6–23)
BUN: 12 mg/dL (ref 6–23)
CHLORIDE: 96 meq/L (ref 96–112)
CO2: 21 mEq/L (ref 19–32)
CO2: 24 mEq/L (ref 19–32)
Calcium: 8.8 mg/dL (ref 8.4–10.5)
Calcium: 8.8 mg/dL (ref 8.4–10.5)
Chloride: 95 mEq/L — ABNORMAL LOW (ref 96–112)
Creatinine, Ser: 0.66 mg/dL (ref 0.50–1.35)
Creatinine, Ser: 0.6 mg/dL (ref 0.50–1.35)
GFR calc Af Amer: >90 mL/min (ref >90)
GFR calc non Af Amer: >90 mL/min (ref >90)
Glucose, Bld: 164 mg/dL — ABNORMAL HIGH (ref 70–99)
Glucose, Bld: 165 mg/dL — ABNORMAL HIGH (ref 70–99)
POTASSIUM: 5.5 meq/L — AB (ref 3.7–5.3)
Potassium: 4.6 mEq/L (ref 3.7–5.3)
SODIUM: 135 meq/L — AB (ref 137–147)
Sodium: 134 mEq/L — ABNORMAL LOW (ref 137–147)

## 2013-06-08 LAB — LIPID PANEL
Cholesterol: 245 mg/dL — ABNORMAL HIGH (ref 0–200)
LDL CALC: UNDETERMINED mg/dL (ref 0–99)
Triglycerides: 2208 mg/dL — ABNORMAL HIGH (ref <150)
VLDL: UNDETERMINED mg/dL (ref 0–40)

## 2013-06-08 LAB — LIPASE, BLOOD: LIPASE: 22 U/L (ref 11–59)

## 2013-06-08 LAB — HEMOGLOBIN A1C
Hgb A1c MFr Bld: 6.7 % — ABNORMAL HIGH (ref <5.7)
Mean Plasma Glucose: 146 mg/dL — ABNORMAL HIGH (ref <117)

## 2013-06-08 LAB — MAGNESIUM: MAGNESIUM: 1.7 mg/dL (ref 1.5–2.5)

## 2013-06-08 MED ORDER — FENOFIBRATE 160 MG PO TABS
160.0000 mg | ORAL_TABLET | Freq: Every day | ORAL | Status: DC
  Administered 2013-06-08 – 2013-06-09 (×2): 160 mg via ORAL
  Filled 2013-06-08 (×2): qty 1

## 2013-06-08 MED ORDER — AMLODIPINE BESYLATE 5 MG PO TABS
5.0000 mg | ORAL_TABLET | Freq: Every day | ORAL | Status: DC
  Filled 2013-06-08: qty 1

## 2013-06-08 MED ORDER — OMEGA-3-ACID ETHYL ESTERS 1 G PO CAPS
2.0000 g | ORAL_CAPSULE | Freq: Two times a day (BID) | ORAL | Status: DC
  Administered 2013-06-08 – 2013-06-09 (×3): 2 g via ORAL
  Filled 2013-06-08 (×4): qty 2

## 2013-06-08 MED ORDER — NITROGLYCERIN 0.4 MG SL SUBL
0.4000 mg | SUBLINGUAL_TABLET | SUBLINGUAL | Status: DC | PRN
  Administered 2013-06-08 (×4): 0.4 mg via SUBLINGUAL
  Filled 2013-06-08 (×3): qty 1

## 2013-06-08 MED ORDER — NITROGLYCERIN 0.4 MG SL SUBL
SUBLINGUAL_TABLET | SUBLINGUAL | Status: AC
  Filled 2013-06-08: qty 1

## 2013-06-08 NOTE — Consult Note (Signed)
Cardiology Consultation Note  Patient ID: Dwayne Silva, MRN: [id number], DOB/AGE: 50-Jan-1967 50 y.o. Admit date: 06/07/2013   Date of Consult: 06/08/2013 Primary Physician: Angelina Sheriff., MD Primary Cardiologist: Candee Furbish consulted in 2010  Chief Complaint: chest pain Reason for Consult: abnormal rhythm on telemetry  HPI: Dwayne Silva is a 48 y/o M with history of HLD, likely newly recognized DM (h/o preDM), family history of CAD, anxiety, and history of chest pain with coronary vasospasm who presented to [facility] 06/07/2013 with essentially constant CP. He has a long history of this pain. The resident team assisted with compiling records from prior outside hospitals. He had a negative cath at [facility] in 2009, negative nuc here at [facility] in 2010. 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 He was admitted 01/2013 at [facility]. for first recognized episode of AF-RVR and spontaneously converted per notes - Lexiscan nuc was normal, 2D echo was completely normal, and CTA was neg for PE, dissection or aneurysm. He continues to have this chest pain. He works as a Training and development officer and gets it when he walks around, but he doesn't have it every time he does this level of activity. He does not exercise. Yesterday he developed L sided chest pressure with associated L shoulder and arm discomfort. It was associated with diaphoresis and nausea. It was not worse with inspiration or palpation. The more severe component lasted around 3 hours (max 8/10) so his wife brought him to the ER. 1 SL NTG brought pain down to 6/10 and it has been hanging around 3/10 ever since. He has been treated  with morphine but it has not fully gone away. He also reports sensation of elevated heart rate on occasion - this tends to happen when he exerts himself, but also when he is at rest. He felt this around 7am. No vomiting, syncope, bleeding issues. Drinks 4-5 beers once a week, no longer smokes, denies drug use. Labs significant for neg troponin x 4, trigs 2208. Lipase is 22.  He does report an episode of palpitations around 7am. Tele reviewed and although it triggers for arrhythmia, this appears to be NSR with motion artifact. Another episode of "NSVT" also has QRS markings that march through.  Past Medical History  Diagnosis Date  . GERD (gastroesophageal reflux disease)   . Anxiety   . Obesity   . Chest pain     a. Neg cath at [facility] 07/2007. b. Neg nuc at [facility] 2010. c. LHC 2012 at [facility] - mod, diffuse coronary vasospasm of distal RCA and RPL, mild nonobst CAD (40% Cx), normal EF. d. Normal nuc at [facility] 01/2013. Echo 01/2013: EF >55, normal LV, normal diastolic function, no pulm HTN, RV normal, no valvular abnormalities, neg CTA for PE.  Marland Kitchen Hyperlipidemia   . Hypertriglyceridemia   . Atrial fibrillation     a. New onset 01/2013, spontaneously converted to sinus during hosp eval at [facility].  . Pre-diabetes   . Coronary vasospasm     a. Suggested by [place] at [facility] in 2012.      Most Recent Cardiac Studies: At [facility] (summarized by resident team) 07/2007 - First LHC - which revealed right dominant anatomy, normal coronary  arteries, and normal EF.  08/2010 - Second LHC - notable for moderate, diffuse coronary vasospasm of the distal RCA and RPL that resolved completely with NG; mild, nonobstructive CAD (40% stenosis of LCx); normal EF 55%. Recommendations were to start amlodipine to treat vasospastic angina and continue primary CAD prevention. Patient denies ever having taken Amlodipine/Norvasc/Dilt.  [facility] 02/05/13: Study was done rest/stress.  Pharmacologic stress was performed with  Lexiscan. There were no significant EKG changes or chest pain. Images demonstrate no transient left ventricular enlargement. There are  no fixed or reversible perfusion defects to suggest infarct or ischemia.  There is normal wall motion and normal calculated left ventricular EF of  61 %.   2D Echo 02/05/13 Interpretation Summary 1. Left ventricle is normal in size and function with an EF >55%. Normal LV wall thickness. Normal diastolic function. 2. Right ventricle is normal in size and function. 3. No significant valvular abnormalities. 4. No pulmonary hypertension. 5. Atria are normal in size.  [facility] 02/04/13 FINDINGS: No pulmonary emboli. No aortic dissection or aneurysm. The lungs are clear. No lung mass or adenopathy. No pleural or pericardial  effusions. Unremarkable upper abdomen. No bony abnormality.   Old Nuc 02/2009 at [facility] 1. No reversible ischemia or infarction. 2. Normal wall motion. 3. Left ventricular ejection fraction equal 51 %   Surgical History: History reviewed. No pertinent past surgical history.   Home Meds: Prior to Admission medications   Medication Sig Start Date End Date Taking? Authorizing Provider  ALPRAZolam Duanne Moron) 0.25 MG tablet Take 0.25 mg by mouth as needed (for palpitations/ rapid heart racing).  05/23/13  Yes Historical Provider, MD  escitalopram (LEXAPRO) 20 MG tablet  05/23/13  Yes Historical Provider, MD  esomeprazole (NEXIUM) 40 MG capsule Take 40 mg by mouth daily at 12 noon.   Yes Historical Provider, MD  Multiple Vitamin (MULTIVITAMIN WITH MINERALS) TABS tablet Take 1 tablet by mouth daily.   Yes Historical Provider, MD  Omega-3 Fatty Acids (OMEGA 3 PO) Take 1 tablet by mouth daily.   Yes Historical Provider, MD    Inpatient Medications:  . [START ON 06/09/2013] amLODipine  5 mg Oral Daily  . aspirin  324 mg Oral Daily  . aspirin  81 mg Oral Daily  . atorvastatin  20 mg Oral q1800  . escitalopram   20 mg Oral Daily  . fenofibrate  160 mg Oral Daily  . heparin  5,000 Units Subcutaneous 3 times per day  . multivitamin with minerals  1 tablet Oral Daily  . omega-3 acid ethyl esters  2 g Oral BID  . pantoprazole  40 mg Oral Daily  . sodium chloride  3 mL Intravenous Q12H      Allergies: No Known Allergies  History   Social History  . Marital Status: Married    Spouse Name: N/A    Number of Children: N/A  . Years of Education: N/A   Occupational History  . Not on file.   Social History Main Topics  . Smoking status: Former Research scientist (life sciences)  . Smokeless tobacco: Not on file  . Alcohol Use: Yes  . Drug Use: No  . Sexual Activity: Not on file   Other Topics Concern  . Not on file   Social History Narrative  . No narrative on file     Family History  Problem Relation Age of Onset  . CAD Brother     MI at age 49  . CAD Mother  MI at age 64     Review of Systems:See above. All other systems reviewed and are otherwise negative except as noted above.  Labs:  Recent Labs  06/07/13 0909 06/07/13 1849  TROPONINI <0.30 <0.30   Lab Results  Component Value Date   WBC 8.3 06/07/2013   HGB 14.3 06/07/2013   HCT 40.5 06/07/2013   MCV 87.3 06/07/2013   PLT 166 06/07/2013    Recent Labs Lab 06/07/13 1848  06/08/13 0905  NA 133*  < > 135*  K 4.3  < > 4.6  CL 94*  < > 95*  CO2 22  < > 24  BUN 13  < > 12  CREATININE 0.78  < > 0.60  CALCIUM 8.6  < > 8.8  PROT 7.0  --   --   BILITOT 0.3  --   --   ALKPHOS 71  --   --   ALT 35  --   --   AST 35  --   --   GLUCOSE 183*  < > 165*  < > = values in this interval not displayed. Lab Results  Component Value Date   CHOL 245* 06/08/2013   HDL NOT REPORTED DUE TO HIGH TRIGLYCERIDES 06/08/2013   LDLCALC UNABLE TO CALCULATE IF TRIGLYCERIDE OVER 400 mg/dL 06/08/2013   TRIG 2208* 06/08/2013    Radiology/Studies:  Dg Chest 2 View 06/07/2013   CLINICAL DATA:  CHEST PAIN  EXAM: CHEST  2 VIEW  COMPARISON:  DG CHEST PORTABLE dated  02/07/2013  FINDINGS: Normal mediastinum and cardiac silhouette. Normal pulmonary vasculature. No evidence of effusion, infiltrate, or pneumothorax. No acute bony abnormality.  IMPRESSION: No acute cardiopulmonary process.   Electronically Signed   By: Suzy Bouchard M.D.   On: 06/07/2013 02:10   EKG: NSR no acute changes  Physical Exam: Blood pressure 119/73, pulse 65, temperature 97.5 F (36.4 C), temperature source Oral, resp. rate 15, height 5\' 9"  (1.753 m), weight 237 lb 3.4 oz (107.6 kg), SpO2 97.00%. General: Well developed, well nourished middle [place] M in no acute distress. Head: Normocephalic, atraumatic, sclera non-icteric, no xanthomas, nares are without discharge.  Neck: Negative for carotid bruits. JVD not elevated. Lungs: Clear bilaterally to auscultation without wheezes, rales, or rhonchi. Breathing is unlabored. Heart: RRR with S1 S2. No murmurs, rubs, or gallops appreciated. Abdomen: Soft, non-tender, non-distended with normoactive bowel sounds. No hepatomegaly. No rebound/guarding. No obvious abdominal masses. Msk:  Strength and tone appear normal for age. Extremities: No clubbing or cyanosis. No edema.  Distal pedal pulses are 2+ and equal bilaterally. Neuro: Alert and oriented X 3. No facial asymmetry. No focal deficit. Moves all extremities spontaneously. Psych:  Responds to questions appropriately with a normal affect.   Assessment and Plan:   1. Chronic recurring chest pain 2. Coronary vasospasm and nonobstructive CAD by cath 2012 3. Hyperlipidemia with severe hypertriglyceridemia 4. Diabetes mellitus (A1c has crossed the treshhold to 6.7) 5. Palpitations this AM not associated with particular arrhythmia 6. Atrial fib 01/2013 CHADSVASC = 1 for diabetes (d/w MD, not clear that cath represents a point for vasc dz)  Will d/w MD.  Rudean Hitt Dunn PA-C 06/08/2013, 12:44 PM  Patient seen and examined with Melina Copa, PA-C. We discussed all aspects of the  encounter. I agree with the assessment and plan as stated above.   Telemetry strips and outside records reviewed in detail including reports of 2 previous caths and recent stress test. Agree no further ischemic w/u needed  at this time. Tele strips reviewed and these are artifact - there is no evidence of arrhythmia. He does c/o palpitations and has h/o AF. Would place 30-day monitor at discharge. CHADs score = 1 now. Would use ASA. Discussed severe hypertriglyceridemia and risk of pancreatitis. He eats a lot of bread and other carbohydrates. Also sedentary with new onset DM2. Will order dietary consult to help educate on diet and exercise. Agree with fibrate and fish oil. Advised to avoid all alcohol.   Daniel Bensimhon,MD 1:33 PM

## 2013-06-08 NOTE — Progress Notes (Signed)
Subjective: Patient seen and examined at the bedside this morning. He is still having chest pain and a burning sensation in his shoulder and neck. Morphine brings the pain down to 5/10. The nitroglycerin he got in the ED really helped yesterday. He denies any abdominal pain, nausea, vomiting. He is tolerating his food. He has been told he has high cholesterol in the past, but it has been "up and down".   Objective: Vital signs in last 24 hours: Filed Vitals:   06/08/13 0000 06/08/13 0240 06/08/13 0400 06/08/13 0715  BP: 132/72 147/84 120/67 138/84  Pulse: 67 78 70 68  Temp:   98.3 F (36.8 C) 97.7 F (36.5 C)  TempSrc:   Oral Oral  Resp: 15 13 14 14   Height:      Weight:      SpO2: 93% 95% 93% 97%   Weight change: -3 lb 12.6 oz (-1.717 kg)  Intake/Output Summary (Last 24 hours) at 06/08/13 1121 Last data filed at 06/08/13 0700  Gross per 24 hour  Intake    483 ml  Output   2425 ml  Net  -1942 ml   Physical Exam  Constitutional: He is oriented to person, place, and time and well-developed, well-nourished, and in no distress.  HENT:  Head: Normocephalic and atraumatic.  Eyes: Conjunctivae and EOM are normal. Pupils are equal, round, and reactive to light.  Neck: Normal range of motion. Neck supple. No JVD present.  Cardiovascular: Normal rate, regular rhythm, normal heart sounds and intact distal pulses. Exam reveals no gallop and no friction rub.  No murmur heard.  Pulmonary/Chest: Effort normal and breath sounds normal. No respiratory distress. He has no wheezes. He has no rales. He exhibits no tenderness.  Abdominal: Soft. Bowel sounds are normal. He exhibits no distension. There is no tenderness.  Musculoskeletal: Normal range of motion. He exhibits no edema and no tenderness.  Neurological: He is alert and oriented to person, place, and time. No cranial nerve deficit. GCS score is 15.  Skin: Skin is warm and dry. Psychiatric: Affect normal.   Lab Results: Basic  Metabolic Panel:  Recent Labs Lab 06/08/13 0305 06/08/13 0905  NA 134* 135*  K 5.5* 4.6  CL 96 95*  CO2 21 24  GLUCOSE 164* 165*  BUN 14 12  CREATININE 0.66 0.60  CALCIUM 8.8 8.8   Liver Function Tests:  Recent Labs Lab 06/07/13 1848  AST 35  ALT 35  ALKPHOS 71  BILITOT 0.3  PROT 7.0  ALBUMIN 3.6    Recent Labs Lab 06/08/13 0905  LIPASE 22   No results found for this basename: AMMONIA,  in the last 168 hours CBC:  Recent Labs Lab 06/07/13 0140 06/07/13 0145 06/07/13 0910  WBC 8.5  --  8.3  HGB 15.3 16.0 14.3  HCT 43.9 47.0 40.5  MCV 87.5  --  87.3  PLT 184  --  166   Cardiac Enzymes:  Recent Labs Lab 06/07/13 0909 06/07/13 1849  TROPONINI <0.30 <0.30   BNP: No results found for this basename: PROBNP,  in the last 168 hours D-Dimer: No results found for this basename: DDIMER,  in the last 168 hours CBG: No results found for this basename: GLUCAP,  in the last 168 hours Hemoglobin A1C:  Recent Labs Lab 06/07/13 0910  HGBA1C 6.7*   Fasting Lipid Panel:  Recent Labs Lab 06/08/13 0305  CHOL 245*  HDL NOT REPORTED DUE TO HIGH TRIGLYCERIDES  LDLCALC UNABLE TO CALCULATE IF  TRIGLYCERIDE OVER 400 mg/dL  TRIG 2208*  CHOLHDL NOT REPORTED DUE TO HIGH TRIGLYCERIDES   Thyroid Function Tests:  Recent Labs Lab 06/07/13 0910  TSH 2.558   Coagulation: No results found for this basename: LABPROT, INR,  in the last 168 hours Anemia Panel: No results found for this basename: VITAMINB12, FOLATE, FERRITIN, TIBC, IRON, RETICCTPCT,  in the last 168 hours Urine Drug Screen: Drugs of Abuse     Component Value Date/Time   LABOPIA POSITIVE* 06/07/2013 1400   COCAINSCRNUR NONE DETECTED 06/07/2013 1400   LABBENZ NONE DETECTED 06/07/2013 1400   AMPHETMU NONE DETECTED 06/07/2013 1400   THCU NONE DETECTED 06/07/2013 1400   LABBARB NONE DETECTED 06/07/2013 1400    Alcohol Level: No results found for this basename: ETH,  in the last 168  hours Urinalysis: No results found for this basename: COLORURINE, APPERANCEUR, LABSPEC, PHURINE, GLUCOSEU, HGBUR, BILIRUBINUR, KETONESUR, PROTEINUR, UROBILINOGEN, NITRITE, LEUKOCYTESUR,  in the last 168 hours   Micro Results: Recent Results (from the past 240 hour(s))  MRSA PCR SCREENING     Status: None   Collection Time    06/07/13 11:10 AM      Result Value Ref Range Status   MRSA by PCR NEGATIVE  NEGATIVE Final   Comment:            The GeneXpert MRSA Assay (FDA     approved for NASAL specimens     only), is one component of a     comprehensive MRSA colonization     surveillance program. It is not     intended to diagnose MRSA     infection nor to guide or     monitor treatment for     MRSA infections.   Studies/Results: Dg Chest 2 View  06/07/2013   CLINICAL DATA:  CHEST PAIN  EXAM: CHEST  2 VIEW  COMPARISON:  DG CHEST PORTABLE dated 02/07/2013  FINDINGS: Normal mediastinum and cardiac silhouette. Normal pulmonary vasculature. No evidence of effusion, infiltrate, or pneumothorax. No acute bony abnormality.  IMPRESSION: No acute cardiopulmonary process.   Electronically Signed   By: Suzy Bouchard M.D.   On: 06/07/2013 02:10   Medications: I have reviewed the patient's current medications. Scheduled Meds: . amLODipine  2.5 mg Oral Daily  . aspirin  324 mg Oral Daily  . aspirin  81 mg Oral Daily  . atorvastatin  20 mg Oral q1800  . escitalopram  20 mg Oral Daily  . heparin  5,000 Units Subcutaneous 3 times per day  . multivitamin with minerals  1 tablet Oral Daily  . pantoprazole  40 mg Oral Daily  . sodium chloride  3 mL Intravenous Q12H   Continuous Infusions:  PRN Meds:.sodium chloride, ALPRAZolam, nitroGLYCERIN, sodium chloride  Assessment/Plan: Dwayne Silva is a 48 y.o. [place] male PMH non-obstructive CAD, vasospastic angina, GERD, anxiety, pre-diabetes, recent hospitalization for new onset AFib with RVR and chest pain rule out in 11/14, not on  anticoagulation, who presents to the ED with a chief complaint of chest pain.   #Persistent chest pain - Still with left-sided chest pain associated with left shoulder, back, and neck discomfort. Partially responsive to morphine. EKG stable. Troponins were negative overnight. UDS negative for cocaine. He has known vasospastic angina (seen on LHC 2012), but was not taking a CCB at home. - Increase amlodipine to 5mg  daily - ASA 81 daily (at high doses ASA is an inhibitor of prostacyclin production and can worsen vasospastic angina) - SL NG 0.4mg   q78min prn chest pain (abortive therapy in vasospastic angina) - Stop morphine - BMP, CBC in am   #Tachyarrhythmia, history of AF with RVR - Patient had several seconds of a tachyarrhythmia early this morning, noted on telemetry. It was narrow complex and regular. Difficult to determine if there are consistent P waves. He has a history of atrial fibrillation with RVR at OSH, and tells [place] he has palpitations frequently at home. He takes ASA daily for CHADS-VASC 0. He has a cardiologist in [place], but cannot recall the name. It sounds like he went there only once after his last hospitalization in 11/14. TSH wnl here.  - Consult to cardiology placed - Continue cardiac monitoring  - May warrant a Holter monitor as an outpatient if these episodes are happening at home  #Severe hypertriglyceridemia - TG >2000. He does have left shoulder pain, but no signs or symptoms of pancreatitis. Lipase wnl. He denies a history of pancreatitis. No need to initiate insulin therapy at this time.  - Starting fenofibrate 160mg  daily - Fish oil (Lovaza) 2g BID - Continue Lipitor 20mg  daily  Lipid Panel     Component Value Date/Time   CHOL 245* 06/08/2013 0305   TRIG 2208* 06/08/2013 0305   HDL NOT REPORTED DUE TO HIGH TRIGLYCERIDES 06/08/2013 0305   CHOLHDL NOT REPORTED DUE TO HIGH TRIGLYCERIDES 06/08/2013 0305   VLDL UNABLE TO CALCULATE IF TRIGLYCERIDE OVER 400 mg/dL 06/08/2013  0305   LDLCALC UNABLE TO CALCULATE IF TRIGLYCERIDE OVER 400 mg/dL 06/08/2013 0305    #Pre-diabetes - A1C 6.7 here. - Consult to diabetes educator - Lifestyle modification  #Anxiety - Continue home Xanax 0.25mg  prn, Lexapro 20mg  daily   #GERD - Protonix 40mg  daily   #DVT PPX - Subq heparin   Dispo: Disposition is deferred at this time, awaiting improvement of current medical problems.  Anticipated discharge in approximately 1-3 day(s).   The patient does have a current PCP Jacqlyn Larsen II, MD) and does not need an [facility] hospital follow-up appointment after discharge.  The patient does not have transportation limitations that hinder transportation to clinic appointments.  .Services Needed at time of discharge: Y = Yes, Blank = No PT:   OT:   RN:   Equipment:   Other:     LOS: 1 day   Lesly Dukes, MD 06/08/2013, 11:21 AM

## 2013-06-08 NOTE — H&P (Signed)
  Date: 06/08/2013  Patient name: Dwayne Silva  Medical record number: [id number]  Date of birth: Apr 28, 1965   I have seen and evaluated Dwayne Silva and discussed their care with the Residency Team. Dwayne Silva was admitted with L sided CP that radiated to L shoulder and neck. This occurred overnight and was severe. It is improved with SL NTG. Food does not make it worse. There was assoc nausea overnight relieved with an anti emetic. He had run of V Tach over night but primary team was not called.   He has long h/o GERD and is on a PPI. This current pain is different than his GERD. 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   Dr Lucila Maine has documented his cardiac hx in her H&P. In 2012 his cath showed diffuse moderate vasospasm. Amlodipine was rec but never started.   Of note, his lipase returned > 2000. He denies h/o pancreatitis. Denies ABD pain and V although had one episode N.   Other than Omega 3, he takes no OTC meds, supplements.   Assessment and Plan: I have seen and evaluated the patient as outlined above. I agree with the formulated Assessment and Plan as detailed in the residents' admission note, with the following changes:   1. Vasospastic angina - He does not need further ischemic W/U. We are tx his sxs with a CCB (amlodipine) & NTG PRN. We will check a Mag level and replete if needed. Insulin resistance is also listed as a RF and his A1C was 6.7 with corresponding random elevated CBG. Cards consult for the V tach although arrhythmias are common in vasospasm.    Other possible etiologies inc diffuse eso spasm although coronary vasospasm is more likely. Eso spasm is also tx with CCB. Pancreatitis is also possible but less likely.  2. Severely elevated triglycerides - Doubt pancreatitis. Will start tx with gemfibrozil. Needs F/U levels.   Bartholomew Crews, MD 3/22/201510:42 AM

## 2013-06-08 NOTE — Progress Notes (Signed)
Prior cardiac work up at outside hospitals:   At [facility]: 11/14 - Patient presented with very similar chest pain, neck pain, arm pain. - Noted to be in A. fib with RVR, rate 160-170s.  - He was placed on a diltiazem drip with improved heart rates and spontaneously cardioverted back to sinus rhythm.  - EKG was without significant ST segment changes.  - A CT pulmonary arteriogram was obtained, which was negative for pulmonary emboli, aortic dissection, or aneurysm.  - Troponins were trended and negative x3.  - Lexiscan Cardiolite stress test was performed and negative - there were no fixed or reversible perfusion defects to suggest infarct or ischemia, wall motion was normal and calculated EF was 61%.  - 2D echo was also performed. It was unremarkable, EF greater than 37%, normal diastolic function.  - As his CHADS-VASC was 0, he was not placed on long-term anticoagulation at discharge, started on antiplatelet therapy therapy with aspirin 325.   At [facility]: 07/2007 - First LHC - which revealed right dominant anatomy, normal coronary arteries, and normal EF.  08/2010 - Second LHC - notable for moderate, diffuse coronary vasospasm of the distal RCA and RPL that resolved completely with NG; mild, nonobstructive CAD (40% stenosis of LCx); normal EF 55%. Recommendations were to start amlodipine to treat vasospastic angina and continue primary CAD prevention. Patient denies ever having taken Amlodipine/Norvasc/Dilt.    Lesly Dukes, MD  [email or web address] Pager # [phone] Office # [phone]

## 2013-06-08 NOTE — Progress Notes (Signed)
Pt c/o of 6/10 chest pressure radiating up left arm to shoulder;  EKG obtained; no changes noted; 1mg  Morphine given IV; will continue to monitor; pt also complaiins of slight nausea;

## 2013-06-09 DIAGNOSIS — E782 Mixed hyperlipidemia: Secondary | ICD-10-CM

## 2013-06-09 DIAGNOSIS — E119 Type 2 diabetes mellitus without complications: Secondary | ICD-10-CM

## 2013-06-09 LAB — BASIC METABOLIC PANEL
BUN: 14 mg/dL (ref 6–23)
CALCIUM: 9.2 mg/dL (ref 8.4–10.5)
CHLORIDE: 96 meq/L (ref 96–112)
CO2: 23 mEq/L (ref 19–32)
CREATININE: 0.69 mg/dL (ref 0.50–1.35)
GFR calc non Af Amer: >90 mL/min (ref >90)
Glucose, Bld: 147 mg/dL — ABNORMAL HIGH (ref 70–99)
Potassium: 5.3 mEq/L (ref 3.7–5.3)
Sodium: 136 mEq/L — ABNORMAL LOW (ref 137–147)

## 2013-06-09 MED ORDER — OMEGA 3 1000 MG PO CAPS: 2.0000 | ORAL_CAPSULE | Freq: Two times a day (BID) | ORAL | Status: DC

## 2013-06-09 MED ORDER — ACETAMINOPHEN 325 MG PO TABS
650.0000 mg | ORAL_TABLET | Freq: Four times a day (QID) | ORAL | Status: DC | PRN
  Administered 2013-06-09 (×2): 650 mg via ORAL
  Filled 2013-06-09 (×2): qty 2

## 2013-06-09 MED ORDER — FENOFIBRATE 160 MG PO TABS: 160.0000 mg | ORAL_TABLET | Freq: Every day | ORAL | Status: DC

## 2013-06-09 MED ORDER — ONDANSETRON HCL 4 MG/2ML IJ SOLN
4.0000 mg | Freq: Once | INTRAMUSCULAR | Status: AC
  Administered 2013-06-09: 4 mg via INTRAVENOUS
  Filled 2013-06-09: qty 2

## 2013-06-09 MED ORDER — DILTIAZEM HCL ER COATED BEADS 240 MG PO CP24
240.0000 mg | ORAL_CAPSULE | Freq: Every day | ORAL | Status: DC
  Administered 2013-06-09: 240 mg via ORAL
  Filled 2013-06-09: qty 1

## 2013-06-09 MED ORDER — ASPIRIN 81 MG PO CHEW: 81.0000 mg | CHEWABLE_TABLET | Freq: Every day | ORAL | Status: DC

## 2013-06-09 MED ORDER — ATORVASTATIN CALCIUM 40 MG PO TABS
40.0000 mg | ORAL_TABLET | Freq: Every day | ORAL | Status: DC
  Filled 2013-06-09: qty 1

## 2013-06-09 MED ORDER — PRAVASTATIN SODIUM 40 MG PO TABS: 40.0000 mg | ORAL_TABLET | Freq: Every day | ORAL | Status: DC

## 2013-06-09 MED ORDER — DILTIAZEM HCL ER COATED BEADS 240 MG PO CP24: 240.0000 mg | ORAL_CAPSULE | Freq: Every day | ORAL | Status: DC

## 2013-06-09 MED ORDER — LIVING WELL WITH DIABETES BOOK
Freq: Once | Status: AC
  Administered 2013-06-09: 11:00:00
  Filled 2013-06-09: qty 1

## 2013-06-09 MED ORDER — NITROGLYCERIN 0.4 MG SL SUBL
0.4000 mg | SUBLINGUAL_TABLET | SUBLINGUAL | Status: AC | PRN
Start: 2013-06-09 — End: ?

## 2013-06-09 NOTE — Discharge Instructions (Signed)
It was a pleasure taking care of you. - Please follow up with your PCP and your cardiologist as scheduled above. - We would like your cardiologist to perform a 30-day cardiac event monitor, to monitor your heart rate and palpitations at home. Please discuss this with him. - You have a condition called spastic angina. This means you get chest pain because your heart arteries spasm. The treatment for this is Diltiazem, a medication we started in the hospital. If you have breakthrough pain, the treatment is nitroglycerin tablets. These can cause headache, but are the best therapy to abort the spasm. You may also use tylenol. - We are starting a new medication for high triglycerides called Fenofibrate. The most common side effect of this is GI discomfort. - We are switching your statin (cholesterol medicine) from Lipitor to a similar medicine called Pravastatin, because Lipitor can interact with Diltiazem. - Please continue to talk to your PCP about your anxiety. - If you develop recurrent severe pain, please call your doctor or return to the ED.

## 2013-06-09 NOTE — Progress Notes (Signed)
Subjective: Patient seen and examined at the bedside this morning. His chest pain is getting better, but he still has a burning sensation in his shoulder and neck. He had some chest pain overnight that improved with Tylenol. (He was offered [facility] but refused as this gives him a headache.)  Objective: Vital signs in last 24 hours: Filed Vitals:   06/08/13 2100 06/09/13 0029 06/09/13 0429 06/09/13 0735  BP: 129/73 122/79 141/93 136/81  Pulse:      Temp:  98 F (36.7 C)  98.1 F (36.7 C)  TempSrc:  Oral  Oral  Resp:    18  Height:      Weight:      SpO2:  96%  97%   Weight change:   Intake/Output Summary (Last 24 hours) at 06/09/13 0919 Last data filed at 06/09/13 0800  Gross per 24 hour  Intake    250 ml  Output    250 ml  Net      0 ml   Physical Exam  Constitutional: He is oriented to person, place, and time and well-developed, well-nourished, and in no distress.  HENT:  Head: Normocephalic and atraumatic.  Eyes: Conjunctivae and EOM are normal. Pupils are equal, round, and reactive to light.  Neck: Normal range of motion. Neck supple. No JVD present.  Cardiovascular: Normal rate, regular rhythm, normal heart sounds and intact distal pulses. Exam reveals no gallop and no friction rub.  No murmur heard.  Pulmonary/Chest: Effort normal and breath sounds normal. No respiratory distress. He has no wheezes. He has no rales. He exhibits no tenderness.  Abdominal: Soft. Bowel sounds are normal. He exhibits no distension. There is no tenderness.  Musculoskeletal: Normal range of motion. He exhibits no edema and no tenderness.  Neurological: He is alert and oriented to person, place, and time. No cranial nerve deficit. GCS score is 15.  Skin: Skin is warm and dry. Psychiatric: Affect normal.   Lab Results: Basic Metabolic Panel:  Recent Labs Lab 06/08/13 0305 06/08/13 0905 06/09/13 0250  NA 134* 135* 136*  K 5.5* 4.6 5.3  CL 96 95* 96  CO2 21 24 23   GLUCOSE 164* 165*  147*  BUN 14 12 14   CREATININE 0.66 0.60 0.69  CALCIUM 8.8 8.8 9.2  MG  --  1.7  --    Liver Function Tests:  Recent Labs Lab 06/07/13 1848  AST 35  ALT 35  ALKPHOS 71  BILITOT 0.3  PROT 7.0  ALBUMIN 3.6    Recent Labs Lab 06/08/13 0905  LIPASE 22   No results found for this basename: AMMONIA,  in the last 168 hours CBC:  Recent Labs Lab 06/07/13 0140 06/07/13 0145 06/07/13 0910  WBC 8.5  --  8.3  HGB 15.3 16.0 14.3  HCT 43.9 47.0 40.5  MCV 87.5  --  87.3  PLT 184  --  166   Cardiac Enzymes:  Recent Labs Lab 06/07/13 0909 06/07/13 1849  TROPONINI <0.30 <0.30   BNP: No results found for this basename: PROBNP,  in the last 168 hours D-Dimer: No results found for this basename: DDIMER,  in the last 168 hours CBG: No results found for this basename: GLUCAP,  in the last 168 hours Hemoglobin A1C:  Recent Labs Lab 06/07/13 0910  HGBA1C 6.7*   Fasting Lipid Panel:  Recent Labs Lab 06/08/13 0305  CHOL 245*  HDL NOT REPORTED DUE TO HIGH TRIGLYCERIDES  LDLCALC UNABLE TO CALCULATE IF TRIGLYCERIDE OVER 400 mg/dL  TRIG 2208*  CHOLHDL NOT REPORTED DUE TO HIGH TRIGLYCERIDES   Thyroid Function Tests:  Recent Labs Lab 06/07/13 0910  TSH 2.558   Coagulation: No results found for this basename: LABPROT, INR,  in the last 168 hours Anemia Panel: No results found for this basename: VITAMINB12, FOLATE, FERRITIN, TIBC, IRON, RETICCTPCT,  in the last 168 hours Urine Drug Screen: Drugs of Abuse     Component Value Date/Time   LABOPIA POSITIVE* 06/07/2013 1400   COCAINSCRNUR NONE DETECTED 06/07/2013 1400   LABBENZ NONE DETECTED 06/07/2013 1400   AMPHETMU NONE DETECTED 06/07/2013 1400   THCU NONE DETECTED 06/07/2013 1400   LABBARB NONE DETECTED 06/07/2013 1400    Alcohol Level: No results found for this basename: ETH,  in the last 168 hours Urinalysis: No results found for this basename: COLORURINE, APPERANCEUR, LABSPEC, PHURINE, GLUCOSEU, HGBUR,  BILIRUBINUR, KETONESUR, PROTEINUR, UROBILINOGEN, NITRITE, LEUKOCYTESUR,  in the last 168 hours   Micro Results: Recent Results (from the past 240 hour(s))  MRSA PCR SCREENING     Status: None   Collection Time    06/07/13 11:10 AM      Result Value Ref Range Status   MRSA by PCR NEGATIVE  NEGATIVE Final   Comment:            The GeneXpert MRSA Assay (FDA     approved for NASAL specimens     only), is one component of a     comprehensive MRSA colonization     surveillance program. It is not     intended to diagnose MRSA     infection nor to guide or     monitor treatment for     MRSA infections.   Studies/Results: No results found. Medications: I have reviewed the patient's current medications. Scheduled Meds: . aspirin  81 mg Oral Daily  . atorvastatin  40 mg Oral q1800  . diltiazem  240 mg Oral Daily  . escitalopram  20 mg Oral Daily  . fenofibrate  160 mg Oral Daily  . heparin  5,000 Units Subcutaneous 3 times per day  . multivitamin with minerals  1 tablet Oral Daily  . omega-3 acid ethyl esters  2 g Oral BID  . pantoprazole  40 mg Oral Daily  . sodium chloride  3 mL Intravenous Q12H   Continuous Infusions:  PRN Meds:.sodium chloride, acetaminophen, ALPRAZolam, nitroGLYCERIN, sodium chloride  Assessment/Plan: Dwayne Silva is a 48 y.o. [place] male PMH non-obstructive CAD, vasospastic angina, GERD, anxiety, pre-diabetes, recent hospitalization for new onset AFib with RVR and chest pain rule out in 11/14, not on anticoagulation, who presents to the ED with a chief complaint of chest pain.   #Persistent chest pain - Still with left-sided chest pain associated with left shoulder, back, and neck discomfort, but improving. EKG stable. Ischemic workup negative. He has known vasospastic angina (seen on LHC 2012), but was not taking a CCB at home. Not a lot of improvement on amlodipine. - Stop amlodipine - Starting Diltiazem 24 hr, 240mg  daily - ASA 81 daily (at high  doses ASA is an inhibitor of prostacyclin production and can worsen vasospastic angina) - SL NG 0.4mg  q40min prn chest pain (abortive therapy in vasospastic angina) - BMP in am  - May be able to go home today pending resolution of chest pain  #Tachyarrhythmia, history of AF with RVR - No overnight events on telemetry. Cards feels episode yesterday was NSR with motion artifact as there were QRS markings that march through. CHADs  score = 1 now given diabetes. - Appreciate cardiology recs - Continue ASA - Continue cardiac monitoring - Needs outpatient cardiology follow up for a 30-day Holter monitor  #Severe hypertriglyceridemia - TG >2000. He does have left shoulder pain, but no signs or symptoms of pancreatitis. Lipase wnl. He denies a history of pancreatitis. No need to initiate insulin therapy at this time.  - Fenofibrate 160mg  daily - Fish oil (Lovaza) 2g BID - Increase Lipitor to 40mg  daily - Dietary consult - Advised to avoid all alcohol  Lipid Panel     Component Value Date/Time   CHOL 245* 06/08/2013 0305   TRIG 2208* 06/08/2013 0305   HDL NOT REPORTED DUE TO HIGH TRIGLYCERIDES 06/08/2013 0305   CHOLHDL NOT REPORTED DUE TO HIGH TRIGLYCERIDES 06/08/2013 0305   VLDL UNABLE TO CALCULATE IF TRIGLYCERIDE OVER 400 mg/dL 06/08/2013 0305   LDLCALC UNABLE TO CALCULATE IF TRIGLYCERIDE OVER 400 mg/dL 06/08/2013 0305    #Pre-diabetes - A1C 6.7 here. - Consult to diabetes educator - Lifestyle modification  #Anxiety - Continue home Xanax 0.25mg  prn, Lexapro 20mg  daily   #GERD - Protonix 40mg  daily  #DVT PPX - Subq heparin   Dispo: Disposition is deferred at this time, awaiting improvement of current medical problems.  Anticipated discharge in approximately 1-3 day(s).   The patient does have a current PCP Jacqlyn Larsen II, MD) and does not need an [facility] hospital follow-up appointment after discharge.  The patient does not have transportation limitations that hinder transportation to  clinic appointments.  .Services Needed at time of discharge: Y = Yes, Blank = No PT:   OT:   RN:   Equipment:   Other:     LOS: 2 days   Lesly Dukes, MD 06/09/2013, 9:19 AM

## 2013-06-09 NOTE — Progress Notes (Signed)
  Date: 06/09/2013  Patient name: Dwayne Silva  Medical record number: [id number]  Date of birth: 1965/06/05   This patient has been seen and the plan of care was discussed with the house staff. Please see their note for complete details. I concur with their findings with the following additions/corrections: Feels well this morning. States his pain improved after getting some tylenol. Agree with tx with diltiazem for coronary spasm. Also agree with change to pravastatin given interaction with atorvastatin. Dr. Lucila Maine has done a great job with his management. Will need outpatient follows up with cards.  Dominic Pea, DO, [facility] 06/09/2013, 3:13 PM

## 2013-06-09 NOTE — Discharge Summary (Signed)
Name: Dwayne Silva MRN: [id number] DOB: 1966/02/10 48 y.o. PCP: Jacqlyn Larsen II, MD  Date of Admission: 06/07/2013  4:54 AM Date of Discharge: 06/09/2013 Attending Physician: Dominic Pea, DO  Discharge Diagnosis: Principal Problem:   Chest pain, history of variant angina Active Problems:   Diabetes   Severe hypertriglyceridemia   GERD (gastroesophageal reflux disease)   Obesity  Discharge Medications:   Medication List         ALPRAZolam 0.25 MG tablet  Commonly known as:  XANAX  Take 0.25 mg by mouth as needed (for palpitations/ rapid heart racing).     aspirin 81 MG chewable tablet  Chew 1 tablet (81 mg total) by mouth daily.     diltiazem 240 MG 24 hr capsule  Commonly known as:  CARDIZEM CD  Take 1 capsule (240 mg total) by mouth daily.     escitalopram 20 MG tablet  Commonly known as:  LEXAPRO     esomeprazole 40 MG capsule  Commonly known as:  NEXIUM  Take 40 mg by mouth daily at 12 noon.     fenofibrate 160 MG tablet  Take 1 tablet (160 mg total) by mouth daily.     multivitamin with minerals Tabs tablet  Take 1 tablet by mouth daily.     nitroGLYCERIN 0.4 MG SL tablet  Commonly known as:  NITROSTAT  Place 1 tablet (0.4 mg total) under the tongue every 5 (five) minutes as needed for chest pain.     Omega 3 1000 MG Caps  Take 2 capsules (2,000 mg total) by mouth 2 (two) times daily.     pravastatin 40 MG tablet  Commonly known as:  PRAVACHOL  Take 1 tablet (40 mg total) by mouth daily.        Disposition and follow-up:   Mr.Dwayne Silva was discharged from [facility] in Stable condition.  At the hospital follow up visit please address:  1.  Recurrent chest pain? Compliance with medications? New diagnosis of diabetes - CBG control? Lifestyle modification?  2.  Labs / imaging needed at time of follow-up: Repeat lipid panel in 4-6 weeks, 30-day Holter monitoring to rule out paroxysmal arrhythmias  3.  Pending labs/  test needing follow-up: None  Follow-up Appointments: Follow-up Information   Follow up with KRASOWSKI,ROBERT, MD On 06/23/2013. ('@3' :30pm, this is your cardiologist.)    Specialty:  Cardiology   Contact information:   [place] [address] [phone]       Follow up with [facility] Angelique Blonder., MD On 07/14/2013. ('@9' :40am. This is your primary care doctor.)    Specialty:  Family Medicine   Contact information:   [place] [address] [phone]       Discharge Instructions: Discharge Orders   Future Orders Complete By Expires   Call MD for:  persistant dizziness or light-headedness  As directed    Call MD for:  persistant nausea and vomiting  As directed    Call MD for:  severe uncontrolled pain  As directed    Call MD for:  temperature >100.4  As directed    Diet - low sodium heart healthy  As directed    Increase activity slowly  As directed       Consultations: Treatment Team:  Rounding Lbcardiology, MD  Procedures Performed:  Dg Chest 2 View  06/07/2013   CLINICAL DATA:  CHEST PAIN  EXAM: CHEST  2 VIEW  COMPARISON:  DG CHEST PORTABLE  dated 02/07/2013  FINDINGS: Normal mediastinum and cardiac silhouette. Normal pulmonary vasculature. No evidence of effusion, infiltrate, or pneumothorax. No acute bony abnormality.  IMPRESSION: No acute cardiopulmonary process.   Electronically Signed   By: Suzy Bouchard M.D.   On: 06/07/2013 02:10    Prior cardiac work up at outside hospitals:   At [facility]:  11/14 - Patient presented with very similar chest pain, neck pain, arm pain.  - Noted to be in A. fib with RVR, rate 160-170s.  - He was placed on a diltiazem drip with improved heart rates and spontaneously cardioverted back to sinus rhythm.  - EKG was without significant ST segment changes.  - A CT pulmonary arteriogram was obtained, which was negative for pulmonary emboli, aortic dissection, or aneurysm.  -  Troponins were trended and negative x3.  - Lexiscan Cardiolite stress test was performed and negative - there were no fixed or reversible perfusion defects to suggest infarct or ischemia, wall motion was normal and calculated EF was 61%.  - 2D echo was also performed. It was unremarkable, EF greater than 22%, normal diastolic function.  - As his CHADS-VASC was 0, he was not placed on long-term anticoagulation at discharge, started on antiplatelet therapy therapy with aspirin 325.   At [facility]:  07/2007 - First LHC - which revealed right dominant anatomy, normal coronary arteries, and normal EF.  08/2010 - Second LHC - notable for moderate, diffuse coronary vasospasm of the distal RCA and RPL that resolved completely with NG; mild, nonobstructive CAD (40% stenosis of LCx); normal EF 55%. Recommendations were to start amlodipine to treat vasospastic angina and continue primary CAD prevention. Patient denies ever having taken Amlodipine/Norvasc/Dilt.   Admission HPI:  Dwayne Silva is a 48 y.o. [place] male PMH non-obstructive CAD, GERD, anxiety, pre-diabetes, who presents to the ED with a chief complaint of chest pain.   Patient reports his symptoms started yesterday afternoon around 5 or 6 PM. He developed left-sided chest pain with associated back, shoulder, and neck discomfort. It is constant. It is not pleuritic. It is associated with diaphoresis and nausea. He had similar symptoms in November for which he was admitted to the hospital in [place], [place].   We were able to obtain records from [facility] Everywhere. Patient presented to [facility] with chest pain, neck pain, and left arm discomfort associated with palpitations in November 2014. At time of that admission, patient was noted to be in A. fib with RVR, rate 160-170s. He was placed on a diltiazem drip with improved heart rates and spontaneously cardioverted back to sinus rhythm. EKG was without significant ST  segment changes. A CT pulmonary arteriogram was obtained, which was negative for pulmonary emboli, aortic dissection, or aneurysm. Troponins were trended and negative x3. Lexiscan Cardiolite stress test was performed and negative - there were no fixed or reversible perfusion defects to suggest infarct or ischemia, wall motion was normal and calculated EF was 61%. 2D echo was also performed. It was unremarkable, EF greater than 29%, normal diastolic function. As his CHADS-VASC was recorded as 0, he was not placed on long-term anticoagulation at discharge. He was started on antiplatelet therapy therapy with aspirin 325.   He has a history of heart catheterizations x2 at [facility]. Records were obtained. First LHC in 07/2007 which revealed right dominant anatomy, normal coronary arteries, and normal EF. Second LHC was in 08/2010 and notable for moderate, diffuse coronary vasospasm of the distal RCA  and RPL that resolved completely with NG; mild, nonobstructive CAD (40% stenosis of LCx); normal EF 55%. Recommendations were to start amlodipine to treat vasospastic angina and continue primary CAD prevention.   He is a prior smoker, one pack per day for many years. He drinks alcohol occasionally. Denies illicit drug use. He works as a Training and development officer in [employer]. He has no recent travel, but did fly to [place] in September. No history of blood clots. He has a family history of heart disease in his mother, she is still alive at age 34 and was diagnosed at age 45.   In the ED he was given aspirin 325 mg, morphine 4 mg IV, Zofran 4 mg IV. He was admitted to the step down unit due to ongoing chest pain.  Physical Exam:  Blood pressure 136/85, pulse 71, temperature 98.1 F (36.7 C), temperature source Oral, resp. rate 15, height '5\' 9"'  (1.753 m), weight 241 lb (109.317 kg), SpO2 96.00%.  Physical Exam  Constitutional: He is oriented to person, place, and time and well-developed, well-nourished, and in no distress.    HENT:  Head: Normocephalic and atraumatic.  Eyes: Conjunctivae and EOM are normal. Pupils are equal, round, and reactive to light.  Neck: Normal range of motion. Neck supple. No JVD present.  Cardiovascular: Normal rate, regular rhythm, normal heart sounds and intact distal pulses. Exam reveals no gallop and no friction rub.  No murmur heard.  Pulmonary/Chest: Effort normal and breath sounds normal. No respiratory distress. He has no wheezes. He has no rales. He exhibits no tenderness.  Abdominal: Soft. Bowel sounds are normal. He exhibits no distension. There is no tenderness.  Musculoskeletal: Normal range of motion. He exhibits no edema and no tenderness.  Neurological: He is alert and oriented to person, place, and time. No cranial nerve deficit. GCS score is 15.  Skin: Skin is warm. He is diaphoretic.  Psychiatric: Affect normal.    Hospital Course by problem list: Joaopedro Skibicki is a 48 y.o. [place] male PMH non-obstructive CAD, vasospastic angina, GERD, anxiety, pre-diabetes, recent hospitalization for new onset AFib with RVR and chest pain rule out in 11/14, not on anticoagulation, who presents to the ED with a chief complaint of chest pain.   1. Chest pain likely 2/2 variant angina - Patient presented with left-sided chest pain associated with left shoulder, back, and neck discomfort concerning for ACS. He had similar symptoms in 11/14 associated with an episode of AFib with RVR. He spontaneously converted to NSR during that hospitalization. ACS rule out was negative at that time and included serial troponins, 2D echo, and a nuclear stress test. He was in NSR on his presentation to [facility] and placed on telemetry to monitor for any episodes of paroxysmal arrhythmiuas. Cath in 08/2010 was nonobstructive but showed evidence of vasospastic angina. Patient was to start a calcium channel blocker (amlodipine) for this, but it is not on his medication list and he denied ever taking  it. Interestingly, per UTD, aspirin should be used with caution, since at high doses it is an inhibitor of prostacyclin production. He takes 325 at home, so we reduced this to 62m daily. TIMI score 1, 5% risk at 14 days of: all-cause mortality, new or recurrent MI, or severe recurrent ischemia requiring urgent revascularization. Wells 0, we did not suspect PE. CXR shows no acute abnormality, no mediastinal widening, no pneumonia. He was diaphoretic on our exam, but otherwise no concerning clinical exam findings. Troponins were trended and negative x3.  The patient was started on amlodipine 2.5 mg and titrated up to 85m as his blood pressure allowed, with SLNG as additional abortive therapy. However he still had continued chest pain on this regimen, so we converted him to Diltiazem 24 hour, 2451mdaily. He responded very well to this medication and was chest pain free at discharge. Follow up was arranged with his primary cardiologist.  2. Tachyarrhythmia, history of AF with RVR at OSH - Patient was noted to have a few seconds of possible tachyarrhythmia on telemetry on the morning of 3/22. Cardiology was consulted and felt the episode was NSR with motion artifact as there were QRS markings that marched through. CHADs-VASc score = 1 given diabetes, so we continued ASA. The patient described to us[place]ome palpitations at home. We recommend outpatient 30-day Holter monitoring to rule out paroxysmal arrhythmias. Vasospastic angina has been associated with arrhythmias.  3. DM2 - A1C 6.7 here. Patient was counseled on lifestyle modification. A registered dietician met with him while he was here.  4. Severe hypertriglyceridemia - TG >2000, as below. He did have left shoulder pain, but no signs or symptoms of pancreatitis. Lipase was wnl. He denies a history of pancreatitis. We started Fenofibrate 16010maily,  Fish oil (Lovaza) 2g BID, and switched his Lipitor to Pravastatin 17m20mily (as diltiazem can interact with  Lipitor). Patient was advised to avoid all alcohol. Please repeat lipid panel in 4-6 weeks. Goal is to reduce TG <1000 to prevent pancreatitis.  Lipid Panel    Component  Value  Date/Time    CHOL  245*  06/08/2013 0305    TRIG  2208*  06/08/2013 0305    HDL  NOT REPORTED DUE TO HIGH TRIGLYCERIDES  06/08/2013 0305    CHOLHDL  NOT REPORTED DUE TO HIGH TRIGLYCERIDES  06/08/2013 0305    VLDL  UNABLE TO CALCULATE IF TRIGLYCERIDE OVER 400 mg/dL  06/08/2013 0305    LDLCALC  UNABLE TO CALCULATE IF TRIGLYCERIDE OVER 400 mg/dL  06/08/2013 0305     5. Anxiety - We continued home Xanax 0.25mg82m, Lexapro 20mg 21my.  6. GERD - We continued Protonix 17mg d44m.   Discharge Vitals:   BP 142/76  Pulse 73  Temp(Src) 98 F (36.7 C) (Oral)  Resp 20  Ht '5\' 9"'  (1.753 m)  Wt 237 lb 3.4 oz (107.6 kg)  BMI 35.01 kg/m2  SpO2 98%  Discharge Labs:  Results for orders placed during the hospital encounter of 06/07/13 (from the past 24 hour(s))  BASIC METABOLIC PANEL     Status: Abnormal   Collection Time    06/09/13  2:50 AM      Result Value Ref Range   Sodium 136 (*) 137 - 147 mEq/L   Potassium 5.3  3.7 - 5.3 mEq/L   Chloride 96  96 - 112 mEq/L   CO2 23  19 - 32 mEq/L   Glucose, Bld 147 (*) 70 - 99 mg/dL   BUN 14  6 - 23 mg/dL   Creatinine, Ser 0.69  0.50 - 1.35 mg/dL   Calcium 9.2  8.4 - 10.5 mg/dL   GFR calc non Af Amer >90  >90 mL/min   GFR calc Af Amer >90  >90 mL/min    Signed: Charish Schroepfer CLesly Dukes24/2015, 9:42 AM   Time Spent on Discharge: 35 minutes Services Ordered on Discharge: None Equipment Ordered on Discharge: None

## 2013-06-09 NOTE — Care Management Note (Signed)
    Page 1 of 1   06/09/2013     10:22:13 AM   CARE MANAGEMENT NOTE 06/09/2013  Patient:  Dwayne Silva   Account Number:  [id number]  Date Initiated:  06/09/2013  Documentation initiated by:  Elissa Hefty  Subjective/Objective Assessment:   adm w ch pain, arrthymia     Action/Plan:   lives w wife, pcp dr Jenny Reichmann redding   Anticipated DC Date:     Anticipated DC Plan:  HOME/SELF CARE         Choice offered to / List presented to:             Status of service:   Medicare Important Message given?   (If response is "NO", the following Medicare IM given date fields will be blank) Date Medicare IM given:   Date Additional Medicare IM given:    Discharge Disposition:    Per UR Regulation:  Reviewed for med. necessity/level of care/duration of stay  If discussed at [place] of Stay Meetings, dates discussed:    Comments:

## 2013-06-09 NOTE — Plan of Care (Signed)
Problem: Food- and Nutrition-Related Knowledge Deficit (NB-1.1) Goal: Nutrition education Formal process to instruct or train a patient/client in a skill or to impart knowledge to help patients/clients voluntarily manage or modify food choices and eating behavior to maintain or improve health. Outcome: Progressing Nutrition Education Note  RD consulted for nutrition education regarding a Heart Healthy diet with special attention to high triglycerides.    Lipid Panel     Component Value Date/Time    CHOL 245* 06/08/2013 0305    TRIG 2208* 06/08/2013 0305    HDL NOT REPORTED DUE TO HIGH TRIGLYCERIDES 06/08/2013 0305    CHOLHDL NOT REPORTED DUE TO HIGH TRIGLYCERIDES 06/08/2013 0305    VLDL UNABLE TO CALCULATE IF TRIGLYCERIDE OVER 400 mg/dL 06/08/2013 0305    LDLCALC UNABLE TO CALCULATE IF TRIGLYCERIDE OVER 400 mg/dL 06/08/2013 0305    RD provided "High Triglyceride Nutrition Therapy" handout from the Academy of [employer]etics. Reviewed patient's dietary recall. Provided examples on ways to decrease sodium and fat intake in diet. Discouraged intake of processed foods and use of salt shaker. Encouraged fresh fruits and vegetables as well as whole grain sources of carbohydrates to maximize fiber intake. Teach back method used.  Expect good compliance.  Body mass index is 35.01 kg/(m^2). Pt meets criteria for obesity based on current BMI.  Current diet order is Heart Healthy, patient is consuming approximately 50% of meals at this time. Labs and medications reviewed. No further nutrition interventions warranted at this time. RD contact information provided. If additional nutrition issues arise, please re-consult RD.  Brynda Greathouse, MS RD LDN Clinical Inpatient Dietitian Pager: [phone] Weekend/After hours pager: [phone]

## 2013-06-09 NOTE — Progress Notes (Signed)
Patient Name: Dwayne Silva Date of Encounter: 06/09/2013  Principal Problem:   Chest pain Active Problems:   Hyperlipidemia   GERD (gastroesophageal reflux disease)   Obesity   Length of Stay: 2  SUBJECTIVE  Chest pain is substantially improved, some nausea after pain meds  CURRENT MEDS . aspirin  81 mg Oral Daily  . atorvastatin  40 mg Oral q1800  . diltiazem  240 mg Oral Daily  . escitalopram  20 mg Oral Daily  . fenofibrate  160 mg Oral Daily  . heparin  5,000 Units Subcutaneous 3 times per day  . living well with diabetes book   Does not apply Once  . multivitamin with minerals  1 tablet Oral Daily  . omega-3 acid ethyl esters  2 g Oral BID  . pantoprazole  40 mg Oral Daily  . sodium chloride  3 mL Intravenous Q12H    OBJECTIVE   Intake/Output Summary (Last 24 hours) at 06/09/13 1116 Last data filed at 06/09/13 0800  Gross per 24 hour  Intake    250 ml  Output    250 ml  Net      0 ml   Filed Weights   06/07/13 0129 06/07/13 1131  Weight: 109.317 kg (241 lb) 107.6 kg (237 lb 3.4 oz)    PHYSICAL EXAM Filed Vitals:   06/08/13 2100 06/09/13 0029 06/09/13 0429 06/09/13 0735  BP: 129/73 122/79 141/93 136/81  Pulse:      Temp:  98 F (36.7 C)  98.1 F (36.7 C)  TempSrc:  Oral  Oral  Resp:    18  Height:      Weight:      SpO2:  96%  97%   General: Alert, oriented x3, no distress Head: no evidence of trauma, PERRL, EOMI, no exophtalmos or lid lag, no myxedema, no xanthelasma; normal ears, nose and oropharynx Neck: normal jugular venous pulsations and no hepatojugular reflux; brisk carotid pulses without delay and no carotid bruits Chest: clear to auscultation, no signs of consolidation by percussion or palpation, normal fremitus, symmetrical and full respiratory excursions Cardiovascular: normal position and quality of the apical impulse, regular rhythm, normal first and second heart sounds, no rubs or gallops, no murmur Abdomen: no tenderness or  distention, no masses by palpation, no abnormal pulsatility or arterial bruits, normal bowel sounds, no hepatosplenomegaly Extremities: no clubbing, cyanosis or edema; 2+ radial, ulnar and brachial pulses bilaterally; 2+ right femoral, posterior tibial and dorsalis pedis pulses; 2+ left femoral, posterior tibial and dorsalis pedis pulses; no subclavian or femoral bruits Neurological: grossly nonfocal  LABS  CBC  Recent Labs  06/07/13 0140 06/07/13 0145 06/07/13 0910  WBC 8.5  --  8.3  HGB 15.3 16.0 14.3  HCT 43.9 47.0 40.5  MCV 87.5  --  87.3  PLT 184  --  130   Basic Metabolic Panel  Recent Labs  06/08/13 0305 06/08/13 0905 06/09/13 0250  NA 134* 135* 136*  K 5.5* 4.6 5.3  CL 96 95* 96  CO2 21 24 23   GLUCOSE 164* 165* 147*  BUN 14 12 14   CREATININE 0.66 0.60 0.69  CALCIUM 8.8 8.8 9.2  MG  --  1.7  --    Liver Function Tests  Recent Labs  06/07/13 1848  AST 35  ALT 35  ALKPHOS 71  BILITOT 0.3  PROT 7.0  ALBUMIN 3.6    Recent Labs  06/08/13 0905  LIPASE 22   Cardiac Enzymes  Recent Labs  06/07/13  4098 06/07/13 1849  TROPONINI <0.30 <0.30   BNP No components found with this basename: POCBNP,  D-Dimer No results found for this basename: DDIMER,  in the last 72 hours Hemoglobin A1C  Recent Labs  06/07/13 0910  HGBA1C 6.7*   Fasting Lipid Panel  Recent Labs  06/08/13 0305  CHOL 245*  HDL NOT REPORTED DUE TO HIGH TRIGLYCERIDES  LDLCALC UNABLE TO CALCULATE IF TRIGLYCERIDE OVER 400 mg/dL  TRIG 2208*  CHOLHDL NOT REPORTED DUE TO HIGH TRIGLYCERIDES   Thyroid Function Tests  Recent Labs  06/07/13 0910  TSH 2.558    Radiology Studies Imaging results have been reviewed  TELE NSR  ECG NSR - 7 separate tracings are all normal during symptoms  ASSESSMENT AND PLAN Recurrent pain with normal ECG makes me wonder whether symptoms are truly cardiac in origin Diltiazem for coronary spasm Will switch to pravastatin since cholesterol is  not that high - triglycerides are major target and since there is a potential for adverse drug interaction diltiazem-atorvastatin. Will arrange op followup, OK for DC from our standpoint.   Sanda Klein, MD, [facility] [phone] office [phone] pager 06/09/2013 11:16 AM

## 2013-06-10 NOTE — Discharge Summary (Signed)
  Date: 06/10/2013  Patient name: Dwayne Silva  Medical record number: [id number]  Date of birth: 04/27/1965   This patient has been seen and the plan of care was discussed with the house staff. Please see their note for complete details. I concur with their findings and plan.  Dominic Pea, DO, [facility] 06/10/2013, 7:57 PM

## 2013-08-10 ENCOUNTER — Other Ambulatory Visit (HOSPITAL_COMMUNITY): Payer: Self-pay | Admitting: Internal Medicine

## 2015-02-04 DIAGNOSIS — I480 Paroxysmal atrial fibrillation: Secondary | ICD-10-CM | POA: Insufficient documentation

## 2015-02-04 DIAGNOSIS — I10 Essential (primary) hypertension: Secondary | ICD-10-CM

## 2015-02-04 DIAGNOSIS — E785 Hyperlipidemia, unspecified: Secondary | ICD-10-CM

## 2015-02-04 DIAGNOSIS — E119 Type 2 diabetes mellitus without complications: Secondary | ICD-10-CM | POA: Insufficient documentation

## 2015-02-04 HISTORY — DX: Type 2 diabetes mellitus without complications: E11.9

## 2015-02-04 HISTORY — DX: Paroxysmal atrial fibrillation: I48.0

## 2015-02-04 HISTORY — DX: Essential (primary) hypertension: I10

## 2015-02-04 HISTORY — DX: Hyperlipidemia, unspecified: E78.5

## 2015-03-21 HISTORY — PX: CARDIAC CATHETERIZATION: SHX172

## 2015-05-19 DIAGNOSIS — J338 Other polyp of sinus: Secondary | ICD-10-CM

## 2015-05-19 DIAGNOSIS — M47812 Spondylosis without myelopathy or radiculopathy, cervical region: Secondary | ICD-10-CM

## 2015-05-19 DIAGNOSIS — J341 Cyst and mucocele of nose and nasal sinus: Secondary | ICD-10-CM

## 2015-05-19 HISTORY — DX: Other polyp of sinus: J33.8

## 2015-05-19 HISTORY — DX: Spondylosis without myelopathy or radiculopathy, cervical region: M47.812

## 2015-05-19 HISTORY — DX: Cyst and mucocele of nose and nasal sinus: J34.1

## 2015-06-23 DIAGNOSIS — M542 Cervicalgia: Secondary | ICD-10-CM

## 2015-06-23 DIAGNOSIS — G8929 Other chronic pain: Secondary | ICD-10-CM

## 2015-06-23 DIAGNOSIS — M545 Low back pain, unspecified: Secondary | ICD-10-CM | POA: Insufficient documentation

## 2015-06-23 HISTORY — DX: Other chronic pain: G89.29

## 2015-06-23 HISTORY — DX: Cervicalgia: M54.2

## 2015-06-23 HISTORY — DX: Low back pain, unspecified: M54.50

## 2016-02-22 DIAGNOSIS — E088 Diabetes mellitus due to underlying condition with unspecified complications: Secondary | ICD-10-CM

## 2016-02-22 HISTORY — DX: Diabetes mellitus due to underlying condition with unspecified complications: E08.8

## 2016-04-07 HISTORY — PX: UPPER GI ENDOSCOPY: SHX6162

## 2016-06-15 DIAGNOSIS — M25562 Pain in left knee: Secondary | ICD-10-CM

## 2016-06-15 DIAGNOSIS — G8929 Other chronic pain: Secondary | ICD-10-CM | POA: Insufficient documentation

## 2016-06-15 DIAGNOSIS — M25512 Pain in left shoulder: Secondary | ICD-10-CM

## 2016-06-15 HISTORY — DX: Pain in left knee: M25.562

## 2016-06-15 HISTORY — DX: Other chronic pain: G89.29

## 2016-06-15 HISTORY — DX: Pain in left shoulder: M25.512

## 2017-03-01 DIAGNOSIS — Z9889 Other specified postprocedural states: Secondary | ICD-10-CM

## 2017-03-01 HISTORY — DX: Other specified postprocedural states: Z98.890

## 2017-05-18 HISTORY — PX: CHOLECYSTECTOMY: SHX55

## 2017-06-06 DIAGNOSIS — K810 Acute cholecystitis: Secondary | ICD-10-CM | POA: Diagnosis not present

## 2017-06-06 DIAGNOSIS — E119 Type 2 diabetes mellitus without complications: Secondary | ICD-10-CM

## 2017-06-07 DIAGNOSIS — R112 Nausea with vomiting, unspecified: Secondary | ICD-10-CM

## 2017-06-07 DIAGNOSIS — K810 Acute cholecystitis: Secondary | ICD-10-CM | POA: Diagnosis not present

## 2017-06-07 DIAGNOSIS — E119 Type 2 diabetes mellitus without complications: Secondary | ICD-10-CM | POA: Diagnosis not present

## 2017-06-08 DIAGNOSIS — E119 Type 2 diabetes mellitus without complications: Secondary | ICD-10-CM | POA: Diagnosis not present

## 2017-06-08 DIAGNOSIS — K810 Acute cholecystitis: Secondary | ICD-10-CM | POA: Diagnosis not present

## 2017-06-09 DIAGNOSIS — K810 Acute cholecystitis: Secondary | ICD-10-CM | POA: Diagnosis not present

## 2017-06-09 DIAGNOSIS — E119 Type 2 diabetes mellitus without complications: Secondary | ICD-10-CM | POA: Diagnosis not present

## 2017-06-10 DIAGNOSIS — E119 Type 2 diabetes mellitus without complications: Secondary | ICD-10-CM | POA: Diagnosis not present

## 2017-06-10 DIAGNOSIS — K810 Acute cholecystitis: Secondary | ICD-10-CM | POA: Diagnosis not present

## 2017-07-09 ENCOUNTER — Encounter: Payer: Self-pay | Admitting: Gastroenterology

## 2017-07-15 DIAGNOSIS — K2100 Gastro-esophageal reflux disease with esophagitis, without bleeding: Secondary | ICD-10-CM | POA: Insufficient documentation

## 2017-07-15 DIAGNOSIS — E7800 Pure hypercholesterolemia, unspecified: Secondary | ICD-10-CM

## 2017-07-15 DIAGNOSIS — F32A Depression, unspecified: Secondary | ICD-10-CM

## 2017-07-15 HISTORY — DX: Depression, unspecified: F32.A

## 2017-07-15 HISTORY — DX: Pure hypercholesterolemia, unspecified: E78.00

## 2017-07-15 HISTORY — DX: Gastro-esophageal reflux disease with esophagitis, without bleeding: K21.00

## 2017-07-26 ENCOUNTER — Encounter: Payer: Self-pay | Admitting: Gastroenterology

## 2017-07-27 ENCOUNTER — Ambulatory Visit: Payer: BLUE CROSS/BLUE SHIELD | Admitting: Gastroenterology

## 2017-08-23 ENCOUNTER — Ambulatory Visit (INDEPENDENT_AMBULATORY_CARE_PROVIDER_SITE_OTHER): Payer: BLUE CROSS/BLUE SHIELD | Admitting: Gastroenterology

## 2017-08-23 ENCOUNTER — Encounter: Payer: Self-pay | Admitting: Gastroenterology

## 2017-08-23 ENCOUNTER — Encounter

## 2017-08-23 VITALS — BP 102/76 | HR 76 | Ht 69.0 in | Wt 220.1 lb

## 2017-08-23 DIAGNOSIS — R109 Unspecified abdominal pain: Secondary | ICD-10-CM | POA: Diagnosis not present

## 2017-08-23 DIAGNOSIS — G8929 Other chronic pain: Secondary | ICD-10-CM | POA: Diagnosis not present

## 2017-08-23 DIAGNOSIS — Z1211 Encounter for screening for malignant neoplasm of colon: Secondary | ICD-10-CM

## 2017-08-23 MED ORDER — SOD PICOSULFATE-MAG OX-CIT ACD 10-3.5-12 MG-GM -GM/160ML PO SOLN: 1.0000 | Freq: Once | ORAL | 0 refills | Status: AC

## 2017-08-23 NOTE — Progress Notes (Signed)
IMPRESSION and PLAN:    #1.  Colorectal cancer screening. Proceed with colonoscopy.  I have discussed the risks and benefits.  The risks including risk of perforation requiring laparotomy, bleeding after polypectomy requiring blood transfusions and risks of anesthesia/sedation were discussed.  Rare risks of missing colorectal neoplasms were also discussed.  Alternatives were given.  Patient is fully aware and agrees to proceed. All the questions were answered. Colonoscopy will be scheduled in upcoming days.  Patient is to report immediately if there is any significant weight loss or excessive bleeding until then. Consent forms were given for review. Hold Xeralto 2 days before.     #2. Fatty Liver with associated DM, obesity and hypercholesterolemia with normal liver function tests.  No evidence of liver cirrhosis. -Weight Loss -Weight loss. Has been abke to lose 15lb over 1 year. #3. GERD without esophagitis. Continue Dexilant 60mg  po qd  Continue Zantac 150 milligrams p.o. Nightly. Nonpharmacologic means of reflux control was stressed.  #4. Chronic L flank pain -likely musculoskeletal.  Rule out other causes.      HPI:    Chief Complaint:   Dwayne Silva is a 52 y.o. 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 During Ramadan - had left back pain with bloating, with change in stool caliber. Now better.  He has been advised to get colonoscopy as a part of colorectal cancer screening. Leaving for [place] on June 14. No melena or hematochezia. He denies having any nausea, vomiting, odynophagia or dysphagia. He does have chronic reflux.  Past GI procedures: -Colonoscopy 08/09/2005 ([facility])-mild sigmoid diverticulosis, internal hemorrhoids.  Otherwise normal colonoscopy to TI.  Negative random colonic biopsies and TI biopsies. -EGD 04/07/2016-mild gastritis with negative biopsies for celiac, eosinophilic esophagitis and negative CLOtest. Past Medical History:   Diagnosis Date  . Abdominal pain, LLQ (left lower quadrant)   . Anxiety   . Atrial fibrillation ([place])    a. New onset 01/2013, spontaneously converted to sinus during hosp eval at [facility].  . Chest pain    a. Neg cath at [facility] 07/2007. b. Neg nuc at [facility] 2010. c. LHC 2012 at [facility] - mod, diffuse coronary vasospasm of distal RCA and RPL, mild nonobst CAD (40% Cx), normal EF. d. Normal nuc at [facility] 01/2013. Echo 01/2013: EF >55, normal LV, normal diastolic function, no pulm HTN, RV normal, no valvular abnormalities, neg CTA for PE.  Marland Kitchen Chronic insomnia   . Coronary vasospasm ([place])    a. Suggested by LHC at [facility] in 2012.  Marland Kitchen Dyspepsia   . Fatty liver   . GERD (gastroesophageal reflux disease)   . Hypercholesterolemia   . Hyperlipidemia   . Hypertriglyceridemia   . IBS (irritable bowel syndrome)   . Obesity   . Oropharyngeal dysphagia   . Pre-diabetes   . Uncontrolled type 2 diabetes mellitus ([place])   . 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 cyclobenzaprine (FLEXERIL) 10 MG tablet Take 10 mg by mouth at bedtime.  2  . dexlansoprazole (DEXILANT) 60 MG capsule Take 60 mg by mouth daily.    Marland Kitchen dicyclomine (BENTYL) 10 MG capsule Take 10 mg by mouth 4 (four) times daily -  before meals and at bedtime.  0  . diltiazem (CARDIZEM CD) 240 MG 24 hr capsule Take 1 capsule (240 mg total) by mouth daily. 30 capsule 1  . escitalopram (LEXAPRO) 20 MG tablet     .  esomeprazole (NEXIUM) 40 MG capsule Take 40 mg by mouth daily at 12 noon.    Marland Kitchen FARXIGA 10 MG TABS tablet Take 10 mg by mouth daily.  0  . fenofibrate 160 MG tablet Take 1 tablet (160 mg total) by mouth daily. 30 tablet 1  . glimepiride (AMARYL) 2 MG tablet Take 2 mg by mouth daily.  0  . meloxicam (MOBIC) 15 MG tablet Take 15 mg by mouth as needed.  0  . metFORMIN (GLUCOPHAGE) 500 MG tablet Take 1,000 mg by mouth at bedtime.  0  . Multiple Vitamin (MULTIVITAMIN WITH MINERALS) TABS  tablet Take 1 tablet by mouth daily.    . nitroGLYCERIN (NITROSTAT) 0.4 MG SL tablet Place 1 tablet (0.4 mg total) under the tongue every 5 (five) minutes as needed for chest pain. 15 tablet 12  . Omega 3 1000 MG CAPS Take 2 capsules (2,000 mg total) by mouth 2 (two) times daily. 120 each 1  . ondansetron (ZOFRAN) 4 MG tablet Take 4 mg by mouth as needed.  0  . ONGLYZA 5 MG TABS tablet Take 5 mg by mouth daily.  0  . pravastatin (PRAVACHOL) 40 MG tablet Take 1 tablet (40 mg total) by mouth daily. 30 tablet 1  . pregabalin (LYRICA) 75 MG capsule Take 75 mg by mouth 3 (three) times daily.    . ranitidine (ZANTAC) 300 MG capsule Take 300 mg by mouth every evening.    . rivaroxaban (XARELTO) 20 MG TABS tablet Take 20 mg by mouth daily with supper.    . tamsulosin (FLOMAX) 0.4 MG CAPS capsule Take 0.4 mg by mouth daily.  1  . testosterone cypionate (DEPOTESTOSTERONE CYPIONATE) 200 MG/ML injection Inject 200 mg into the muscle every 14 (fourteen) days.  2   No current facility-administered medications for this visit.     Past Surgical History:  Procedure Laterality Date  . APPENDECTOMY  1985  . CHOLECYSTECTOMY  05/2017  . COLONOSCOPY  07/2005   Internal hemorrhoids, otherwise normal exam to the TI. BX: submucosal lymphoid hyperplasia.  . COLONOSCOPY  07/2001   internal hemorrhoids, negative colonoscopy to TI.  Marland Kitchen HEMORROIDECTOMY  04/24/2006  . UPPER GI ENDOSCOPY  03/2004   Normal  . UPPER GI ENDOSCOPY  04/07/2016   Gastritis. SI Bx: normal duodenal mucosa. Esophagus Bx: Normal Squamous Mucosa.    Family History  Problem Relation Age of Onset  . CAD Brother        MI at age 57  . CAD Mother        MI at age 67    Social History   Tobacco Use  . Smoking status: Former Research scientist (life sciences)  . Smokeless tobacco: Never Used  Substance Use Topics  . Alcohol use: Yes    Comment: 5-6 beers once per week  . Drug use: No    Allergies  Allergen Reactions  . Iodine Rash     Review of  Systems: All systems reviewed and negative except where noted in HPI.    Physical Exam:     BP 102/76   Pulse 76   Ht 5\' 9"  (1.753 m)   Wt 220 lb 2 oz (99.8 kg)   BMI 32.51 kg/m  @WEIGHTLAST3 @ GENERAL:  Alert, oriented, cooperative, not in acute distress. PSYCH: :Pleasant, normal mood and affect. HEENT:  conjunctiva pink, mucous membranes moist, neck supple without masses. No jaundice. CARDIAC:  S1 S2 normal. No murmers. PULM: Normal respiratory effort, lungs CTA bilaterally, no wheezing. ABDOMEN: Inspection: No  visible peristalsis, no abnormal pulsations, skin normal.  Palpation/percussion: Soft, nontender, nondistended, no rigidity, no abnormal dullness to percussion, no hepatosplenomegaly and no palpable abdominal masses.  Auscultation: Normal bowel sounds, no abdominal bruits. Rectal exam: Deferred SKIN:  turgor, no lesions seen. Musculoskeletal:  Normal muscle tone, normal strength. NEURO: Alert and oriented x 3, no focal neurologic deficits.    Sanyah Molnar,MD 08/23/2017, 2:09 PM   CC Lin Landsman Angelique Blonder, MD

## 2017-08-23 NOTE — Patient Instructions (Signed)
If you are age 54 or older, your body mass index should be between 23-30. Your Body mass index is 32.51 kg/m. If this is out of the aforementioned range listed, please consider follow up with your Primary Care Provider.  If you are age 93 or younger, your body mass index should be between 19-25. Your Body mass index is 32.51 kg/m. If this is out of the aformentioned range listed, please consider follow up with your Primary Care Provider.   You have been scheduled for a colonoscopy. Please follow written instructions given to you at your visit today.  Please pick up your prep supplies at the pharmacy within the next 1-3 days. If you use inhalers (even only as needed), please bring them with you on the day of your procedure. Your physician has requested that you go to www.startemmi.com and enter the access code given to you at your visit today. This web site gives a general overview about your procedure. However, you should still follow specific instructions given to you by our office regarding your preparation for the procedure.  We have sent the following medications to your pharmacy for you to pick up at your convenience: Clenpiq  Thank you,  Dr. Jackquline Denmark

## 2017-08-26 ENCOUNTER — Telehealth: Payer: Self-pay | Admitting: Gastroenterology

## 2017-08-26 NOTE — Telephone Encounter (Signed)
This patient called the service 6/9 AM reporting fever and need to cancel 6/10 colonoscopy. He did not answer the phone when I called back several minutes later, so I left him a message to call back if needs advice.   It sounds like he will not come for colonoscopy 6/10.  Please have [facility] nursing call in AM for more information so you can decide about reschedule.  - HD

## 2017-08-27 ENCOUNTER — Encounter: Payer: BLUE CROSS/BLUE SHIELD | Admitting: Gastroenterology

## 2017-12-08 DIAGNOSIS — E119 Type 2 diabetes mellitus without complications: Secondary | ICD-10-CM | POA: Diagnosis not present

## 2017-12-08 DIAGNOSIS — R0789 Other chest pain: Secondary | ICD-10-CM

## 2017-12-08 DIAGNOSIS — R079 Chest pain, unspecified: Secondary | ICD-10-CM

## 2017-12-08 DIAGNOSIS — I10 Essential (primary) hypertension: Secondary | ICD-10-CM

## 2017-12-09 DIAGNOSIS — R0789 Other chest pain: Secondary | ICD-10-CM | POA: Diagnosis not present

## 2017-12-09 DIAGNOSIS — E119 Type 2 diabetes mellitus without complications: Secondary | ICD-10-CM | POA: Diagnosis not present

## 2017-12-09 DIAGNOSIS — I10 Essential (primary) hypertension: Secondary | ICD-10-CM | POA: Diagnosis not present

## 2017-12-19 ENCOUNTER — Telehealth: Payer: Self-pay | Admitting: Gastroenterology

## 2017-12-19 ENCOUNTER — Encounter: Payer: Self-pay | Admitting: Gastroenterology

## 2017-12-19 ENCOUNTER — Telehealth: Payer: Self-pay

## 2017-12-19 NOTE — Telephone Encounter (Signed)
Sent patient new instructions, I advised him that I would send a letter to Dr. Lennox Pippins to see how long he should hold his Xarelto.

## 2017-12-19 NOTE — Telephone Encounter (Signed)
[facility] Pre-operative Risk Assessment     Request for surgical clearance:     Endoscopy Procedure  What type of surgery is being performed?     Colonoscopy  When is this surgery scheduled?     01/22/18  What type of clearance is required ?   Pharmacy  Are there any medications that need to be held prior to surgery and how long? Xarelto  Practice name and name of physician performing surgery?      [facility]          Jackquline Denmark  What is your office phone and fax number?      Phone- [phone]  [phone]  Anesthesia type (None, local, MAC, general) ?       MAC

## 2017-12-20 NOTE — Telephone Encounter (Signed)
I have sent an anticoagulation letter to Welford Roche regarding patients Xarelto.

## 2017-12-20 NOTE — Telephone Encounter (Signed)
This patient is not a patient of [facility]. I do see that they have seen Dr. Geraldo Pitter back in 2017, but would recommend contacting prescribing physician.

## 2018-01-02 NOTE — Telephone Encounter (Signed)
I sent a letter to hold Xarelto to Welford Roche she said to hold Xarelto for at least 24 hours before the patients procedure. Your note said to hold Xarelto for 48 hours. Would you like to hold the Xarelto for 48 hours?

## 2018-01-03 NOTE — Telephone Encounter (Signed)
Cr clearence is Nl Lets hold 24hrs before

## 2018-01-03 NOTE — Telephone Encounter (Signed)
Patient was notified by phone.

## 2018-01-07 ENCOUNTER — Encounter: Payer: Self-pay | Admitting: Gastroenterology

## 2018-01-22 ENCOUNTER — Encounter: Payer: BLUE CROSS/BLUE SHIELD | Admitting: Gastroenterology

## 2018-02-13 ENCOUNTER — Ambulatory Visit (AMBULATORY_SURGERY_CENTER): Payer: Self-pay | Admitting: *Deleted

## 2018-02-13 ENCOUNTER — Encounter: Payer: Self-pay | Admitting: Gastroenterology

## 2018-02-13 VITALS — Ht 69.0 in | Wt 216.0 lb

## 2018-02-13 DIAGNOSIS — Z1211 Encounter for screening for malignant neoplasm of colon: Secondary | ICD-10-CM

## 2018-02-13 NOTE — Progress Notes (Signed)
Patient denies any allergies to eggs or soy. Patient denies any problems with anesthesia/sedation. Patient denies any oxygen use at home. Patient denies taking any diet/weight loss medications. Patient has Clenpiq at home, instructions given. Patient is aware to hold Xarelto for 24 hours prior to colonoscopy.

## 2018-02-21 ENCOUNTER — Encounter: Payer: Self-pay | Admitting: Gastroenterology

## 2018-02-21 ENCOUNTER — Ambulatory Visit (AMBULATORY_SURGERY_CENTER): Payer: BLUE CROSS/BLUE SHIELD | Admitting: Gastroenterology

## 2018-02-21 VITALS — BP 123/81 | HR 86 | Temp 97.8°F | Resp 18 | Ht 69.0 in | Wt 216.0 lb

## 2018-02-21 DIAGNOSIS — D122 Benign neoplasm of ascending colon: Secondary | ICD-10-CM | POA: Diagnosis not present

## 2018-02-21 DIAGNOSIS — Z1211 Encounter for screening for malignant neoplasm of colon: Secondary | ICD-10-CM

## 2018-02-21 DIAGNOSIS — D125 Benign neoplasm of sigmoid colon: Secondary | ICD-10-CM | POA: Diagnosis not present

## 2018-02-21 MED ORDER — SODIUM CHLORIDE 0.9 % IV SOLN: 500.0000 mL | Freq: Once | INTRAVENOUS | Status: DC

## 2018-02-21 NOTE — Patient Instructions (Addendum)
Handout Provided:  Polyps  RESUME Xarelto at prior dose tomorrow.   YOU HAD AN ENDOSCOPIC PROCEDURE TODAY AT [facility]:   Refer to the procedure report that was given to you for any specific questions about what was found during the examination.  If the procedure report does not answer your questions, please call your gastroenterologist to clarify.  If you requested that your care partner not be given the details of your procedure findings, then the procedure report has been included in a sealed envelope for you to review at your convenience later.  YOU SHOULD EXPECT: Some feelings of bloating in the abdomen. Passage of more gas than usual.  Walking can help get rid of the air that was put into your GI tract during the procedure and reduce the bloating. If you had a lower endoscopy (such as a colonoscopy or flexible sigmoidoscopy) you may notice spotting of blood in your stool or on the toilet paper. If you underwent a bowel prep for your procedure, you may not have a normal bowel movement for a few days.  Please Note:  You might notice some irritation and congestion in your nose or some drainage.  This is from the oxygen used during your procedure.  There is no need for concern and it should clear up in a day or so.  SYMPTOMS TO REPORT IMMEDIATELY:   Following lower endoscopy (colonoscopy or flexible sigmoidoscopy):  Excessive amounts of blood in the stool  Significant tenderness or worsening of abdominal pains  Swelling of the abdomen that is new, acute  Fever of 100F or higher  For urgent or emergent issues, a gastroenterologist can be reached at any hour by calling [phone].   DIET:  We do recommend a small meal at first, but then you may proceed to your regular diet.  Drink plenty of fluids but you should avoid alcoholic beverages for 24 hours.  ACTIVITY:  You should plan to take it easy for the rest of today and you should NOT DRIVE or use heavy machinery until  tomorrow (because of the sedation medicines used during the test).    FOLLOW UP: Our staff will call the number listed on your records the next business day following your procedure to check on you and address any questions or concerns that you may have regarding the information given to you following your procedure. If we do not reach you, we will leave a message.  However, if you are feeling well and you are not experiencing any problems, there is no need to return our call.  We will assume that you have returned to your regular daily activities without incident.  If any biopsies were taken you will be contacted by phone or by letter within the next 1-3 weeks.  Please call us at [phone] if you have not heard about the biopsies in 3 weeks.    SIGNATURES/CONFIDENTIALITY: You and/or your care partner have signed paperwork which will be entered into your electronic medical record.  These signatures attest to the fact that that the information above on your After Visit Summary has been reviewed and is understood.  Full responsibility of the confidentiality of this discharge information lies with you and/or your care-partner.

## 2018-02-21 NOTE — Progress Notes (Signed)
Called to room to assist during endoscopic procedure.  Patient ID and intended procedure confirmed with present staff. Received instructions for my participation in the procedure from the performing physician.  

## 2018-02-21 NOTE — Progress Notes (Signed)
Report to PACU, RN, vss, BBS= Clear.  

## 2018-02-21 NOTE — Op Note (Signed)
[facility] Patient Name: Dwayne Silva Procedure Date: 02/21/2018 11:27 AM MRN: [id number] Endoscopist: Jackquline Denmark , MD Age: 52 Referring MD:  Date of Birth: [birth year] Gender: Male Account #: [id number] Procedure:                Colonoscopy Indications:              Screening for colorectal malignant neoplasm Medicines:                Monitored Anesthesia Care Procedure:                Pre-Anesthesia Assessment:                           - Prior to the procedure, a History and Physical                            was performed, and patient medications and                            allergies were reviewed. The patient's tolerance of                            previous anesthesia was also reviewed. The risks                            and benefits of the procedure and the sedation                            options and risks were discussed with the patient.                            All questions were answered, and informed consent                            was obtained. Prior Anticoagulants: The patient has                            taken Xarelto (rivaroxaban), last dose was 2 days                            prior to procedure. ASA Grade Assessment: II - A                            patient with mild systemic disease. After reviewing                            the risks and benefits, the patient was deemed in                            satisfactory condition to undergo the procedure.                           After obtaining informed consent, the colonoscope  was passed under direct vision. Throughout the                            procedure, the patient's blood pressure, pulse, and                            oxygen saturations were monitored continuously. The                            Colonoscope was introduced through the anus and                            advanced to the the cecum, identified by                            appendiceal  orifice and ileocecal valve. The                            colonoscopy was performed without difficulty. The                            patient tolerated the procedure well. The quality                            of the bowel preparation was adequate to identify                            polyps. Scope In: 11:30:50 AM Scope Out: 11:48:57 AM Scope Withdrawal Time: 0 hours 14 minutes 34 seconds  Total Procedure Duration: 0 hours 18 minutes 7 seconds  Findings:                 A 8 mm polyp was found in the distal ascending                            colon. The polyp was sessile. The polyp was removed                            with a cold snare. Resection and retrieval were                            complete. Estimated blood loss was minimal.                           A 4 mm polyp was found in the sigmoid colon. The                            polyp was sessile. The polyp was removed with a                            cold snare. Resection and retrieval were complete.                           A single  small-mouthed diverticulum was found in                            the sigmoid colon.                           Non-bleeding internal hemorrhoids were found during                            retroflexion. The hemorrhoids were small. Scar from                            previous hemorrhoidectomy.                           The exam was otherwise without abnormality on                            direct and retroflexion views. Complications:            No immediate complications. Estimated Blood Loss:     Estimated blood loss: none. Impression:               -Colonic polyps status post polypectomy                           -Very minimal sigmoid diverticulosis                           -Small non-bleeding internal hemorrhoids. Previous                            anorectal surgery.                           -Otherwise grossly normal colonoscopy to cecum. Recommendation:           - Patient has a  contact number available for                            emergencies. The signs and symptoms of potential                            delayed complications were discussed with the                            patient. Return to normal activities tomorrow.                            Written discharge instructions were provided to the                            patient.                           - Resume previous diet.                           -  Continue present medications.                           - Resume Xarelto (rivaroxaban) at prior dose                            tomorrow.                           - Await pathology results.                           - Repeat colonoscopy for surveillance based on                            pathology results.                           - Return to GI clinic PRN. Jackquline Denmark, MD 02/21/2018 11:55:28 AM This report has been signed electronically.

## 2018-02-21 NOTE — Progress Notes (Signed)
Pt's states no medical or surgical changes since previsit or office visit. 

## 2018-02-22 ENCOUNTER — Telehealth: Payer: Self-pay | Admitting: *Deleted

## 2018-02-22 ENCOUNTER — Telehealth: Payer: Self-pay

## 2018-02-22 NOTE — Telephone Encounter (Signed)
  Follow up Call-  Call back number 02/21/2018  Post procedure Call Back phone  # [phone]  Permission to leave phone message Yes  Some recent data might be hidden     Patient questions:  Do you have a fever, pain , or abdominal swelling? No. Pain Score  0 *  Have you tolerated food without any problems? Yes.    Have you been able to return to your normal activities? Yes.    Do you have any questions about your discharge instructions: Diet   No. Medications  No. Follow up visit  No.  Do you have questions or concerns about your Care? No.  Actions: * If pain score is 4 or above: No action needed, pain <4.

## 2018-02-22 NOTE — Telephone Encounter (Signed)
Called [phone] and left a messaged we tried to reach pt for a follow up call. maw

## 2018-02-26 ENCOUNTER — Encounter: Payer: Self-pay | Admitting: Gastroenterology

## 2018-08-08 DIAGNOSIS — R109 Unspecified abdominal pain: Secondary | ICD-10-CM

## 2018-08-08 HISTORY — DX: Unspecified abdominal pain: R10.9

## 2018-09-05 DIAGNOSIS — M25571 Pain in right ankle and joints of right foot: Secondary | ICD-10-CM | POA: Insufficient documentation

## 2018-09-05 DIAGNOSIS — S93429A Sprain of deltoid ligament of unspecified ankle, initial encounter: Secondary | ICD-10-CM | POA: Insufficient documentation

## 2018-09-05 DIAGNOSIS — M25572 Pain in left ankle and joints of left foot: Secondary | ICD-10-CM | POA: Insufficient documentation

## 2018-09-05 HISTORY — DX: Pain in right ankle and joints of right foot: M25.571

## 2018-09-05 HISTORY — DX: Sprain of deltoid ligament of unspecified ankle, initial encounter: S93.429A

## 2018-09-05 HISTORY — DX: Pain in left ankle and joints of left foot: M25.572

## 2018-10-24 DIAGNOSIS — F5104 Psychophysiologic insomnia: Secondary | ICD-10-CM

## 2018-10-24 DIAGNOSIS — N401 Enlarged prostate with lower urinary tract symptoms: Secondary | ICD-10-CM

## 2018-10-24 HISTORY — DX: Benign prostatic hyperplasia with lower urinary tract symptoms: N40.1

## 2018-10-24 HISTORY — DX: Psychophysiologic insomnia: F51.04

## 2019-04-18 DIAGNOSIS — M25512 Pain in left shoulder: Secondary | ICD-10-CM

## 2019-04-18 DIAGNOSIS — G8929 Other chronic pain: Secondary | ICD-10-CM

## 2019-04-18 HISTORY — DX: Other chronic pain: G89.29

## 2019-04-18 HISTORY — DX: Pain in left shoulder: M25.512

## 2019-07-25 ENCOUNTER — Encounter: Payer: Self-pay | Admitting: General Practice

## 2019-10-18 DIAGNOSIS — R079 Chest pain, unspecified: Secondary | ICD-10-CM | POA: Diagnosis not present

## 2019-10-20 DIAGNOSIS — Z7901 Long term (current) use of anticoagulants: Secondary | ICD-10-CM

## 2019-10-20 DIAGNOSIS — E785 Hyperlipidemia, unspecified: Secondary | ICD-10-CM

## 2019-10-20 DIAGNOSIS — R079 Chest pain, unspecified: Secondary | ICD-10-CM | POA: Diagnosis not present

## 2019-10-20 DIAGNOSIS — I480 Paroxysmal atrial fibrillation: Secondary | ICD-10-CM | POA: Diagnosis not present

## 2019-10-20 DIAGNOSIS — I2510 Atherosclerotic heart disease of native coronary artery without angina pectoris: Secondary | ICD-10-CM | POA: Diagnosis not present

## 2019-10-20 DIAGNOSIS — E1165 Type 2 diabetes mellitus with hyperglycemia: Secondary | ICD-10-CM | POA: Diagnosis not present

## 2019-10-23 ENCOUNTER — Other Ambulatory Visit: Payer: Self-pay

## 2019-10-23 ENCOUNTER — Ambulatory Visit: Payer: BLUE CROSS/BLUE SHIELD | Admitting: Cardiology

## 2019-10-23 ENCOUNTER — Encounter: Payer: Self-pay | Admitting: Cardiology

## 2019-10-23 VITALS — BP 118/72 | HR 90 | Ht 69.0 in | Wt 216.0 lb

## 2019-10-23 DIAGNOSIS — E088 Diabetes mellitus due to underlying condition with unspecified complications: Secondary | ICD-10-CM | POA: Diagnosis not present

## 2019-10-23 DIAGNOSIS — I480 Paroxysmal atrial fibrillation: Secondary | ICD-10-CM

## 2019-10-23 DIAGNOSIS — I10 Essential (primary) hypertension: Secondary | ICD-10-CM | POA: Diagnosis not present

## 2019-10-23 DIAGNOSIS — I209 Angina pectoris, unspecified: Secondary | ICD-10-CM

## 2019-10-23 DIAGNOSIS — E782 Mixed hyperlipidemia: Secondary | ICD-10-CM

## 2019-10-23 DIAGNOSIS — R9439 Abnormal result of other cardiovascular function study: Secondary | ICD-10-CM

## 2019-10-23 DIAGNOSIS — R079 Chest pain, unspecified: Secondary | ICD-10-CM

## 2019-10-23 HISTORY — DX: Abnormal result of other cardiovascular function study: R94.39

## 2019-10-23 HISTORY — DX: Angina pectoris, unspecified: I20.9

## 2019-10-23 MED ORDER — ALPRAZOLAM 0.25 MG PO TABS
0.2500 mg | ORAL_TABLET | ORAL | 0 refills | Status: DC | PRN
Start: 2019-10-23 — End: 2021-04-11

## 2019-10-23 MED ORDER — METOPROLOL TARTRATE 100 MG PO TABS
100.0000 mg | ORAL_TABLET | Freq: Once | ORAL | 0 refills | Status: DC
Start: 2019-10-23 — End: 2020-03-01

## 2019-10-23 NOTE — Patient Instructions (Addendum)
Medication Instructions:  Your physician recommends that you continue on your current medications as directed. Please refer to the Current Medication list given to you today.  *If you need a refill on your cardiac medications before your next appointment, please call your pharmacy*   Lab Work: NONE If you have labs (blood work) drawn today and your tests are completely normal, you will receive your results only by: Marland Kitchen MyChart Message (if you have MyChart) OR . A paper copy in the mail If you have any lab test that is abnormal or we need to change your treatment, we will call you to review the results.   Testing/Procedures: YOUR PHYSICIAN RECOMMENDS THAT YOU HAVE A CORONARY CT DONE AT [facility]   Follow-Up: At [facility], you and your health needs are our priority.  As part of our continuing mission to provide you with exceptional heart care, we have created designated Provider Care Teams.  These Care Teams include your primary Cardiologist (physician) and Advanced Practice Providers (APPs -  Physician Assistants and Nurse Practitioners) who all work together to provide you with the care you need, when you need it.  We recommend signing up for the patient portal called "MyChart".  Sign up information is provided on this After Visit Summary.  MyChart is used to connect with patients for Virtual Visits (Telemedicine).  Patients are able to view lab/test results, encounter notes, upcoming appointments, etc.  Non-urgent messages can be sent to your provider as well.   To learn more about what you can do with MyChart, go to [email or web address].    Your next appointment:   6 month(s)  The format for your next appointment:   In Person  Provider:   Jyl Heinz, MD   Other Instructions  Your cardiac CT will be scheduled at one of the below locations:   [facility] [address] [place], [place] [address] [phone]  If scheduled at [facility], please arrive at the [facility] main entrance of [facility] 30 minutes prior to test start time. Proceed to the [facility] Radiology Department (first floor) to check-in and test prep.  Please follow these instructions carefully (unless otherwise directed):  Hold all erectile dysfunction medications at least 3 days (72 hrs) prior to test.  On the Night Before the Test: . Be sure to Drink plenty of water. . Do not consume any caffeinated/decaffeinated beverages or chocolate 12 hours prior to your test. . Do not take any antihistamines 12 hours prior to your test.  On the Day of the Test: . Drink plenty of water. Do not drink any water within one hour of the test. . Do not eat any food 4 hours prior to the test. . You may take your regular medications prior to the test.  . Take metoprolol (Lopressor) 100 MG two hours prior to test. . HOLD Furosemide/Hydrochlorothiazide morning of the test. . YOU MAY TO XANAX 0.25 MG FOR ANXIETY. YOU MUST HAVE SOMEONE DRIVE YOU TO YOUR TEST.       After the Test: . Drink plenty of water. . After receiving IV contrast, you may experience a mild flushed feeling. This is normal. . On occasion, you may experience a mild rash up to 24 hours after the test. This is not dangerous. If this occurs, you can take Benadryl 25 mg and increase your fluid intake. . If you experience trouble breathing, this can be serious. If it is severe call 911 IMMEDIATELY.  If it is mild, please call our office. . If you take any of these medications: Glipizide/Metformin, Avandament, Glucavance, please do not take 48 hours after completing test unless otherwise instructed.   Once we have confirmed authorization from your insurance company, we will call you to set up a date and time for your test. Based on how quickly your insurance processes prior authorizations requests, please allow up to 4 weeks to be contacted for scheduling your Cardiac CT appointment. Be  advised that routine Cardiac CT appointments could be scheduled as many as 8 weeks after your provider has ordered it.  For non-scheduling related questions, please contact the cardiac imaging nurse navigator should you have any questions/concerns: Marchia Bond, Cardiac Imaging Nurse Navigator Burley Saver, Interim Cardiac Imaging Nurse [place] [facility] Direct Office Dial: [phone]   For scheduling needs, including cancellations and rescheduling, please call Vivien Rota at [phone], option 3.

## 2019-10-23 NOTE — Addendum Note (Signed)
Addended by: Jacinta Shoe on: 10/23/2019 02:40 PM   Modules accepted: Orders

## 2019-10-23 NOTE — Progress Notes (Signed)
Cardiology Office Note:    Date:  10/23/2019   ID:  Dwayne Silva, DOB 1966/03/14, MRN [id number]  PCP:  [facility]  Cardiologist:  Dwayne Lindau, MD   Referring MD: Dwayne Roche, NP    ASSESSMENT:    1. Mixed hyperlipidemia   2. Paroxysmal atrial fibrillation (HCC)   3. Essential hypertension   4. Diabetes mellitus due to underlying condition with unspecified complications ([place])   5. Chest pain, unspecified type   6. Abnormal nuclear stress test   7. Angina pectoris ([place])    PLAN:    In order of problems listed above:  1. Abnormal nuclear stress test and angina pectoris: I discussed my findings with the patient at extensive length.  Stress test report was detailed to him.  In view of this and multiple risk factors including diabetes we will do a CT coronary angiography with FFR and he is agreeable.  Benefits and potential risks explained and he vocalized understanding.  He was urged to give himself well-hydrated and he agrees to do so. 2. Paroxysmal atrial fibrillation:I discussed with the patient atrial fibrillation, disease process. Management and therapy including rate and rhythm control, anticoagulation benefits and potential risks were discussed extensively with the patient. Patient had multiple questions which were answered to patient's satisfaction. 3. Essential hypertension: Blood pressure stable 4. Mixed dyslipidemia and diabetes mellitus: Managed by primary care physician.  Patient is trying to do his best to lose weight and exercise.  I told him to hold exercise still is CT FFR is done and is agreeable.  He knows to go to nearest emergency room for any concerning symptoms.Patient will be seen in follow-up appointment in 6 months or earlier if the patient has any concerns    Medication Adjustments/Labs and Tests Ordered: Current medicines are reviewed at length with the patient today.  Concerns regarding medicines are outlined above.  Orders  Placed This Encounter  Procedures  . CT CORONARY MORPH W/CTA COR W/SCORE W/CA W/CM &/OR WO/CM  . CT CORONARY FRACTIONAL FLOW RESERVE DATA PREP  . CT CORONARY FRACTIONAL FLOW RESERVE FLUID ANALYSIS   Meds ordered this encounter  Medications  . metoprolol tartrate (LOPRESSOR) 100 MG tablet    Sig: Take 1 tablet (100 mg total) by mouth once for 1 dose.    Dispense:  1 tablet    Refill:  0     No chief complaint on file.    History of Present Illness:    Dwayne Silva is a 54 y.o. male.  Patient has past medical history of paroxysmal atrial fibrillation, essential hypertension, dyslipidemia and diabetes mellitus.  He had a negative cath in 2009.  He is admitted to the hospital with chest pain.  He mentions to me that he had chest tightness like episode his stress test was mildly abnormal and low risk.  He was discharged home he is here to see me.  He denies any problems at this time since hospital discharge.  No chest pain orthopnea or PND.  At the time of my evaluation, the patient is alert awake oriented and in no distress.  Past Medical History:  Diagnosis Date  . Abdominal pain, LLQ (left lower quadrant)   . Anxiety   . Atrial fibrillation ([place])    a. New onset 01/2013, spontaneously converted to sinus during hosp eval at [facility].  . Chest pain    a. Neg cath at [facility] 07/2007. b. Neg nuc at [facility] 2010. c. LHC 2012 at  [facility] - mod, diffuse coronary vasospasm of distal RCA and RPL, mild nonobst CAD (40% Cx), normal EF. d. Normal nuc at [facility] 01/2013. Echo 01/2013: EF >55, normal LV, normal diastolic function, no pulm HTN, RV normal, no valvular abnormalities, neg CTA for PE.  Marland Kitchen Chronic insomnia   . Coronary vasospasm ([place])    a. Suggested by LHC at [facility] in 2012.  . Depression   . Dyspepsia   . Fatty liver   . GERD (gastroesophageal reflux disease)   . Hypercholesterolemia   . Hyperlipidemia   . Hypertriglyceridemia   . IBS (irritable bowel syndrome)   . Obesity   .  Oropharyngeal dysphagia   . Pre-diabetes   . Uncontrolled type 2 diabetes mellitus ([place])   . 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 APPENDECTOMY  1985  . CARDIAC CATHETERIZATION  2017   "no problems" per pt done in [facility]  . CHOLECYSTECTOMY  05/2017  . COLONOSCOPY  07/2005   Internal hemorrhoids, otherwise normal exam to the TI. BX: submucosal lymphoid hyperplasia.  . COLONOSCOPY  07/2001   internal hemorrhoids, negative colonoscopy to TI.  Marland Kitchen HEMORROIDECTOMY  04/24/2006  . UPPER GI ENDOSCOPY  03/2004   Normal  . UPPER GI ENDOSCOPY  04/07/2016   Gastritis. SI Bx: normal duodenal mucosa. Esophagus Bx: Normal Squamous Mucosa.    Current Medications: Current Meds  Medication Sig  . albuterol (VENTOLIN HFA) 108 (90 Base) MCG/ACT inhaler Inhale 2 puffs into the lungs every 4 (four) hours.  Marland Kitchen amitriptyline (ELAVIL) 25 MG tablet Take 25 mg by mouth at bedtime.  . benzonatate (TESSALON) 100 MG capsule Take 100 mg by mouth 3 (three) times daily as needed for cough.  . cyclobenzaprine (FLEXERIL) 10 MG tablet Take 10 mg by mouth 3 (three) times daily as needed.  Marland Kitchen dexlansoprazole (DEXILANT) 60 MG capsule Take 60 mg by mouth daily.  Marland Kitchen dicyclomine (BENTYL) 10 MG capsule Take 10 mg by mouth 4 (four) times daily -  before meals and at bedtime.  Marland Kitchen diltiazem (TIAZAC) 360 MG 24 hr capsule Take 360 mg by mouth daily.  Marland Kitchen escitalopram (LEXAPRO) 20 MG tablet   . famotidine (PEPCID) 40 MG tablet Take 40 mg by mouth daily.  Marland Kitchen FARXIGA 10 MG TABS tablet Take 10 mg by mouth daily.  . fluticasone (FLONASE) 50 MCG/ACT nasal spray Place 1 spray into both nostrils daily.  Marland Kitchen glimepiride (AMARYL) 2 MG tablet Take 2 mg by mouth daily.  Marland Kitchen HYDROcodone-acetaminophen (NORCO) 10-325 MG tablet Take 1 tablet by mouth every 6 (six) hours as needed.  Marland Kitchen ibuprofen (ADVIL,MOTRIN) 200 MG tablet Take 200 mg by mouth every 6 (six) hours as needed.  . isosorbide mononitrate  (IMDUR) 30 MG 24 hr tablet Take 30 mg by mouth daily.  Marland Kitchen MATZIM LA 360 MG 24 hr tablet Take 360 mg by mouth daily.  . meclizine (ANTIVERT) 25 MG tablet Take 25 mg by mouth 3 (three) times daily as needed for dizziness.  . meloxicam (MOBIC) 15 MG tablet Take 15 mg by mouth as needed.  . metFORMIN (GLUCOPHAGE) 500 MG tablet Take 1,000 mg by mouth at bedtime.  . montelukast (SINGULAIR) 10 MG tablet Take 10 mg by mouth at bedtime.  . Multiple Vitamin (MULTIVITAMIN WITH MINERALS) TABS tablet Take 1 tablet by mouth daily.  Marland Kitchen NARCAN 4 MG/0.1ML LIQD nasal spray kit Place 1 spray into the nose.  . nitroGLYCERIN (NITROSTAT) 0.4 MG SL  tablet Place 1 tablet (0.4 mg total) under the tongue every 5 (five) minutes as needed for chest pain.  . Omega 3 1000 MG CAPS Take 2 capsules (2,000 mg total) by mouth 2 (two) times daily.  . ondansetron (ZOFRAN) 4 MG tablet Take 4 mg by mouth as needed.  . ONGLYZA 5 MG TABS tablet Take 5 mg by mouth daily.  . pravastatin (PRAVACHOL) 80 MG tablet Take 80 mg by mouth at bedtime.  . pregabalin (LYRICA) 100 MG capsule Take 100 mg by mouth 4 (four) times daily.  . rivaroxaban (XARELTO) 20 MG TABS tablet Take 20 mg by mouth daily with supper.  . sildenafil (VIAGRA) 50 MG tablet Take 50 mg by mouth daily as needed.  . tamsulosin (FLOMAX) 0.4 MG CAPS capsule Take 0.4 mg by mouth daily.  . Vitamin D, Ergocalciferol, (DRISDOL) 1.25 MG (50000 UNIT) CAPS capsule Take 50,000 Units by mouth once a week.  . [DISCONTINUED] cyclobenzaprine (FLEXERIL) 10 MG tablet Take 10 mg by mouth 3 (three) times daily as needed for muscle spasms.     Allergies:   Iodinated diagnostic agents, Penicillin g, and Iodine   Social History   Socioeconomic History  . Marital status: Married    Spouse name: Not on file  . Number of children: Not on file  . Years of education: Not on file  . Highest education level: Not on file  Occupational History  . Not on file  Tobacco Use  . Smoking status:  Former Research scientist (life sciences)  . Smokeless tobacco: Never Used  Vaping Use  . Vaping Use: Never used  Substance and Sexual Activity  . Alcohol use: Not Currently  . Drug use: No  . Sexual activity: Not on file  Other Topics Concern  . Not on file  Social History Narrative  . Not on file   Social Determinants of Health   Financial Resource Strain:   . Difficulty of Paying Living Expenses:   Food Insecurity:   . Worried About Charity fundraiser in the Last Year:   . Arboriculturist in the Last Year:   Transportation Needs:   . Film/video editor (Medical):   Marland Kitchen Lack of Transportation (Non-Medical):   Physical Activity:   . Days of Exercise per Week:   . Minutes of Exercise per Session:   Stress:   . Feeling of Stress :   Social Connections:   . Frequency of Communication with Friends and Family:   . Frequency of Social Gatherings with Friends and Family:   . Attends Religious Services:   . Active Member of Clubs or Organizations:   . Attends Archivist Meetings:   Marland Kitchen Marital Status:      Family History: The patient's family history includes CAD in his brother and mother. There is no history of Colon cancer, Colon polyps, Esophageal cancer, Rectal cancer, or Stomach cancer.  ROS:   Please see the history of present illness.    All other systems reviewed and are negative.  EKGs/Labs/Other Studies Reviewed:    The following studies were reviewed today: I reviewed records at extensive length.  The stress test reveals no convincing evidence of ischemia but a suspicious area in the inferior segment of the left ventricle.  Systolic function was normal.   Recent Labs: No results found for requested labs within last 8760 hours.  Recent Lipid Panel    Component Value Date/Time   CHOL 245 (H) 06/08/2013 0305   TRIG 2,208 (H)  06/08/2013 0305   HDL NOT REPORTED DUE TO HIGH TRIGLYCERIDES 06/08/2013 0305   CHOLHDL NOT REPORTED DUE TO HIGH TRIGLYCERIDES 06/08/2013 0305   VLDL  UNABLE TO CALCULATE IF TRIGLYCERIDE OVER 400 mg/dL 06/08/2013 0305   LDLCALC UNABLE TO CALCULATE IF TRIGLYCERIDE OVER 400 mg/dL 06/08/2013 0305    Physical Exam:    VS:  BP 118/72   Pulse 90   Ht _0  (1.753 m)   Wt 216 lb (98 kg)   SpO2 97%   BMI 31.90 kg/m     Wt Readings from Last 3 Encounters:  10/23/19 216 lb (98 kg)  02/21/18 216 lb (98 kg)  02/13/18 216 lb (98 kg)     GEN: Patient is in no acute distress HEENT: Normal NECK: No JVD; No carotid bruits LYMPHATICS: No lymphadenopathy CARDIAC: Hear sounds regular, 2/6 systolic murmur at the apex. RESPIRATORY:  Clear to auscultation without rales, wheezing or rhonchi  ABDOMEN: Soft, non-tender, non-distended MUSCULOSKELETAL:  No edema; No deformity  SKIN: Warm and dry NEUROLOGIC:  Alert and oriented x 3 PSYCHIATRIC:  Normal affect   Signed, Dwayne Lindau, MD  10/23/2019 2:34 PM    [place]

## 2019-11-18 ENCOUNTER — Telehealth: Payer: Self-pay | Admitting: Cardiology

## 2019-11-18 NOTE — Telephone Encounter (Signed)
Pt c/o Shortness Of Breath: STAT if SOB developed within the last 24 hours or pt is noticeably SOB on the phone  1. Are you currently SOB (can you hear that pt is SOB on the phone)? Yes  2. How long have you been experiencing SOB? A couple days  3. Are you SOB when sitting or when up moving around? Sitting down  4. Are you currently experiencing any other symptoms? A lot of sweating, feeling weak, bad headache

## 2019-11-18 NOTE — Telephone Encounter (Signed)
Left message to call back  

## 2019-11-19 NOTE — Telephone Encounter (Signed)
Pt is currently in the hospital. 

## 2019-12-21 DIAGNOSIS — T402X5A Adverse effect of other opioids, initial encounter: Secondary | ICD-10-CM

## 2019-12-21 DIAGNOSIS — K5903 Drug induced constipation: Secondary | ICD-10-CM

## 2019-12-21 HISTORY — DX: Adverse effect of other opioids, initial encounter: T40.2X5A

## 2019-12-21 HISTORY — DX: Drug induced constipation: K59.03

## 2020-01-19 HISTORY — PX: CARDIAC CATHETERIZATION: SHX172

## 2020-01-27 ENCOUNTER — Telehealth: Payer: Self-pay

## 2020-01-27 ENCOUNTER — Ambulatory Visit: Payer: BLUE CROSS/BLUE SHIELD | Admitting: Gastroenterology

## 2020-01-27 NOTE — Telephone Encounter (Signed)
Message left for pt to call regarding the cardiac CT.

## 2020-01-27 NOTE — Telephone Encounter (Signed)
-----   Message from Jenean Lindau, MD sent at 01/27/2020 11:29 AM EST ----- Regarding: FW: ct heart Pl call pt ----- Message ----- From: Roosvelt Maser Sent: 01/27/2020  11:17 AM EST To: Jenean Lindau, MD, Jacinta Shoe, CMA Subject: ct heart                                         I have left this patient 3 messages with no response.   Thanks  Vivien Rota

## 2020-03-01 ENCOUNTER — Ambulatory Visit: Payer: BLUE CROSS/BLUE SHIELD | Admitting: Gastroenterology

## 2020-03-01 ENCOUNTER — Encounter: Payer: Self-pay | Admitting: Gastroenterology

## 2020-03-01 VITALS — BP 134/90 | HR 105 | Ht 70.0 in | Wt 221.1 lb

## 2020-03-01 DIAGNOSIS — R1310 Dysphagia, unspecified: Secondary | ICD-10-CM

## 2020-03-01 DIAGNOSIS — K219 Gastro-esophageal reflux disease without esophagitis: Secondary | ICD-10-CM

## 2020-03-01 MED ORDER — FAMOTIDINE 40 MG PO TABS
40.0000 mg | ORAL_TABLET | Freq: Every day | ORAL | 6 refills | Status: DC
Start: 2020-03-01 — End: 2021-03-23

## 2020-03-01 NOTE — Patient Instructions (Signed)
If you are age 56 or older, your body mass index should be between 23-30. Your Body mass index is 31.73 kg/m. If this is out of the aforementioned range listed, please consider follow up with your Primary Care Provider.  If you are age 19 or younger, your body mass index should be between 19-25. Your Body mass index is 31.73 kg/m. If this is out of the aformentioned range listed, please consider follow up with your Primary Care Provider.   We have sent the following medications to your pharmacy for you to pick up at your convenience: [place] have been scheduled for an endoscopy. Please follow written instructions given to you at your visit today. If you use inhalers (even only as needed), please bring them with you on the day of your procedure.   Hold your Xarelto 2 days before your procedure.  Thank you,  Dr. Jackquline Denmark

## 2020-03-01 NOTE — Progress Notes (Signed)
IMPRESSION and PLAN:    #1.  GERD with dysphagia with atypical chest pains, neg cardiac cath 11/2019  -Continue Dexilant 56m po qd  -Add Pepcid 40 mg po qhs #90, 6 refills -Nonpharmacologic means of reflux control was stressed. -Raise the head of the bed by 30 degrees. -Proceed with EGD with dil if needed -Hold Xeralto 2 days before.     #2. Fatty Liver with associated DM, obesity and hypercholesterolemia with normal liver function tests.  No evidence of liver cirrhosis. -Weight Loss -Weight loss.   #3. RUQ pain. S/P lap chole d/t biliary dyskinesia 05/2016 -Assess at the time of EGD -If still with problems, [place] complete followed by [facility] if needed.  #4. IBS with alt diarrhea/constipation with bloating. May have lactose intol -?regular vs Lactaid milk to see if it makes any difference  #5. H/O tubular adenomas. -Next colonoscopy due 02/2021.      HPI:    Chief Complaint:   RTramel Westbrookis a 54y.o. male  With A Fib on Xarelto, atypical chest pains with neg cath 11/2019 and has been advised GI work-up  C/O GERD with regurgitation 6-7/night which would occasionally wake him up.  He also has heartburn despite Dexilant 60 mg p.o. every morning.  Has been having intermittent right upper quadrant abdominal pain associated with regurgitation.  Pain gets worse after eating.  He denies having any nausea or vomiting.  Occasional history of dysphagia mostly to solids in the mid chest which he occasionally has to wash with liquids.  This has been occurring over the last 1 month.  No melena or hematochezia.  No recent weight loss.  He is a diabetic and his diabetes is not under very good control.  He has been trying to lose weight.  Wt Readings from Last 3 Encounters:  03/01/20 221 lb 2 oz (100.3 kg)  10/23/19 216 lb (98 kg)  02/21/18 216 lb (98 kg)     Past GI procedures: -Colonoscopy 02/2018 -Colonic polyps status post polypectomy. Bx-tubular adenomas. Rpt in 3  yrs -Very minimal sigmoid diverticulosis -Small non-bleeding internal hemorrhoids. Previous anorectal surgery. -Otherwise grossly normal colonoscopy to cecum.  -EGD 04/07/2016-mild gastritis with neg biopsies for celiac, eosinophilic esophagitis and negative CLOtest. Past Medical History:  Diagnosis Date  . Abdominal pain, LLQ (left lower quadrant)   . Anxiety   . Atrial fibrillation ([place]    a. New onset 01/2013, [facility]  . [facility]    [facility], diffuse [facility] vasospasm of distal RCA and RPL, mild nonobst CAD (40% Cx), normal EF. [facility] 01/2013: EF >55, normal LV, normal diastolic function, no pulm HTN, RV normal, no valvular abnormalities, neg CTA for PE.  .Marland KitchenChronic insomnia   . Coronary vasospasm ([place]    a. [facility]  . Depression   . Dyspepsia   . Fatty liver   . GERD (gastroesophageal reflux disease)   . Hypercholesterolemia   . Hyperlipidemia   . Hypertriglyceridemia   . IBS (irritable bowel syndrome)   . Obesity   . Oropharyngeal dysphagia   . Pre-diabetes   . Uncontrolled type 2 diabetes mellitus ([place]   . 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 albuterol (VENTOLIN  HFA) 108 (90 Base) MCG/ACT inhaler Inhale 2 puffs into the lungs every 4 (four) hours.    Marland Kitchen amitriptyline (ELAVIL) 25 MG tablet Take 25 mg by mouth at bedtime.    . benzonatate (TESSALON) 100 MG capsule Take 100 mg by mouth 3 (three) times daily as needed for cough.    . cyclobenzaprine (FLEXERIL) 10 MG tablet Take 10 mg by mouth 3 (three) times daily as needed.    Marland Kitchen dexlansoprazole (DEXILANT) 60 MG capsule Take 60 mg by mouth daily.    Marland Kitchen dicyclomine (BENTYL) 10 MG capsule Take 10 mg by mouth 4 (four) times daily -  before meals and at bedtime.  0  . diltiazem  (TIAZAC) 360 MG 24 hr capsule Take 360 mg by mouth daily.    Marland Kitchen escitalopram (LEXAPRO) 20 MG tablet     . famotidine (PEPCID) 40 MG tablet Take 40 mg by mouth daily.    Marland Kitchen FARXIGA 10 MG TABS tablet Take 10 mg by mouth daily.  0  . fluticasone (FLONASE) 50 MCG/ACT nasal spray Place 1 spray into both nostrils daily.    Marland Kitchen glimepiride (AMARYL) 2 MG tablet Take 2 mg by mouth daily.  0  . ibuprofen (ADVIL,MOTRIN) 200 MG tablet Take 200 mg by mouth every 6 (six) hours as needed.    . isosorbide mononitrate (IMDUR) 30 MG 24 hr tablet Take 30 mg by mouth daily.    Marland Kitchen MATZIM LA 360 MG 24 hr tablet Take 360 mg by mouth daily.    . meclizine (ANTIVERT) 25 MG tablet Take 25 mg by mouth 3 (three) times daily as needed for dizziness.    . meloxicam (MOBIC) 15 MG tablet Take 15 mg by mouth as needed.  0  . metFORMIN (GLUCOPHAGE) 500 MG tablet Take 1,000 mg by mouth at bedtime.  0  . montelukast (SINGULAIR) 10 MG tablet Take 10 mg by mouth at bedtime.    . Multiple Vitamin (MULTIVITAMIN WITH MINERALS) TABS tablet Take 1 tablet by mouth daily.    Marland Kitchen NARCAN 4 MG/0.1ML LIQD nasal spray kit Place 1 spray into the nose.    . nitroGLYCERIN (NITROSTAT) 0.4 MG SL tablet Place 1 tablet (0.4 mg total) under the tongue every 5 (five) minutes as needed for chest pain. 15 tablet 12  . Omega 3 1000 MG CAPS Take 2 capsules (2,000 mg total) by mouth 2 (two) times daily. 120 each 1  . ondansetron (ZOFRAN) 4 MG tablet Take 4 mg by mouth as needed.  0  . ONGLYZA 5 MG TABS tablet Take 5 mg by mouth daily.  0  . oxyCODONE-acetaminophen (PERCOCET) 7.5-325 MG tablet Take 1 tablet by mouth 4 (four) times daily as needed.    . pravastatin (PRAVACHOL) 80 MG tablet Take 80 mg by mouth at bedtime.    . pregabalin (LYRICA) 100 MG capsule Take 100 mg by mouth 4 (four) times daily.    . rivaroxaban (XARELTO) 20 MG TABS tablet Take 20 mg by mouth daily with supper.    . sildenafil (VIAGRA) 50 MG tablet Take 50 mg by mouth daily as needed.    .  tamsulosin (FLOMAX) 0.4 MG CAPS capsule Take 0.4 mg by mouth daily.  1  . Vitamin D, Ergocalciferol, (DRISDOL) 1.25 MG (50000 UNIT) CAPS capsule Take 50,000 Units by mouth once a week.    . ALPRAZolam (XANAX) 0.25 MG tablet Take 1 tablet (0.25 mg total) by mouth as needed for anxiety. (Patient not taking: Reported on 03/01/2020)  5 tablet 0   No current facility-administered medications for this visit.    Past Surgical History:  Procedure Laterality Date  . APPENDECTOMY  1985  . CARDIAC CATHETERIZATION  2017   "no problems" per pt done in [facility]  . CHOLECYSTECTOMY  05/2017  . COLONOSCOPY  07/2005   Internal hemorrhoids, otherwise normal exam to the TI. BX: submucosal lymphoid hyperplasia.  . COLONOSCOPY  07/2001   internal hemorrhoids, negative colonoscopy to TI.  Marland Kitchen HEMORROIDECTOMY  04/24/2006  . UPPER GI ENDOSCOPY  03/2004   Normal  . UPPER GI ENDOSCOPY  04/07/2016   Gastritis. SI Bx: normal duodenal mucosa. Esophagus Bx: Normal Squamous Mucosa.    Family History  Problem Relation Age of Onset  . CAD Brother        MI at age 76  . CAD Mother        MI at age 56  . Colon cancer Neg Hx   . Colon polyps Neg Hx   . Esophageal cancer Neg Hx   . Rectal cancer Neg Hx   . Stomach cancer Neg Hx     Social History   Tobacco Use  . Smoking status: Former Research scientist (life sciences)  . Smokeless tobacco: Never Used  Vaping Use  . Vaping Use: Never used  Substance Use Topics  . Alcohol use: Not Currently  . Drug use: No    Allergies  Allergen Reactions  . Iodinated Diagnostic Agents   . Penicillin G Nausea And Vomiting  . Iodine Rash     Review of Systems: All systems reviewed and negative except where noted in HPI.    Physical Exam:     Ht 5' 10" (1.778 m)   Wt 221 lb 2 oz (100.3 kg)   BMI 31.73 kg/m  _0 @ GENERAL:  Alert, oriented, cooperative, not in acute distress. PSYCH: :Pleasant, normal mood and affect. HEENT:  conjunctiva pink, mucous membranes moist, neck  supple without masses. No jaundice. CARDIAC:  S1 S2 normal. No murmers. PULM: Normal respiratory effort, lungs CTA bilaterally, no wheezing. ABDOMEN: Inspection: No visible peristalsis, no abnormal pulsations, skin normal.  Palpation/percussion: Soft, nontender, nondistended, no rigidity, no abnormal dullness to percussion, no hepatosplenomegaly and no palpable abdominal masses.  Auscultation: Normal bowel sounds, no abdominal bruits. Rectal exam: Deferred SKIN:  turgor, no lesions seen. Musculoskeletal:  Normal muscle tone, normal strength. NEURO: Alert and oriented x 3, no focal neurologic deficits.    Dwayne Krantz,MD 03/01/2020, 4:16 PM   [facility] [place] [facility]

## 2020-03-23 ENCOUNTER — Encounter: Payer: Self-pay | Admitting: Gastroenterology

## 2020-03-25 ENCOUNTER — Encounter: Payer: Self-pay | Admitting: Gastroenterology

## 2020-03-29 ENCOUNTER — Encounter: Payer: BLUE CROSS/BLUE SHIELD | Admitting: Gastroenterology

## 2020-03-29 ENCOUNTER — Telehealth: Payer: Self-pay | Admitting: Gastroenterology

## 2020-03-29 NOTE — Telephone Encounter (Signed)
Hi Dr. Lyndel Safe, this pt left a message yesterday with the answering service cancelling his EGD that is scheduled with you today. He did not provide a reason for cancellation. Thank you.

## 2020-03-29 NOTE — Telephone Encounter (Signed)
No problems.  Thanks for letting me know RG 

## 2020-04-21 ENCOUNTER — Other Ambulatory Visit: Payer: Self-pay

## 2020-04-21 DIAGNOSIS — F419 Anxiety disorder, unspecified: Secondary | ICD-10-CM | POA: Insufficient documentation

## 2020-04-21 DIAGNOSIS — R1312 Dysphagia, oropharyngeal phase: Secondary | ICD-10-CM | POA: Insufficient documentation

## 2020-04-21 DIAGNOSIS — R7303 Prediabetes: Secondary | ICD-10-CM | POA: Insufficient documentation

## 2020-04-21 DIAGNOSIS — E7800 Pure hypercholesterolemia, unspecified: Secondary | ICD-10-CM | POA: Insufficient documentation

## 2020-04-21 DIAGNOSIS — I201 Angina pectoris with documented spasm: Secondary | ICD-10-CM | POA: Insufficient documentation

## 2020-04-21 DIAGNOSIS — E781 Pure hyperglyceridemia: Secondary | ICD-10-CM | POA: Insufficient documentation

## 2020-04-21 DIAGNOSIS — E1165 Type 2 diabetes mellitus with hyperglycemia: Secondary | ICD-10-CM | POA: Insufficient documentation

## 2020-04-21 DIAGNOSIS — F5104 Psychophysiologic insomnia: Secondary | ICD-10-CM | POA: Insufficient documentation

## 2020-04-21 DIAGNOSIS — R1032 Left lower quadrant pain: Secondary | ICD-10-CM | POA: Insufficient documentation

## 2020-04-21 DIAGNOSIS — E119 Type 2 diabetes mellitus without complications: Secondary | ICD-10-CM | POA: Insufficient documentation

## 2020-04-21 DIAGNOSIS — R1013 Epigastric pain: Secondary | ICD-10-CM | POA: Insufficient documentation

## 2020-04-21 DIAGNOSIS — K760 Fatty (change of) liver, not elsewhere classified: Secondary | ICD-10-CM | POA: Insufficient documentation

## 2020-04-21 DIAGNOSIS — K589 Irritable bowel syndrome without diarrhea: Secondary | ICD-10-CM | POA: Insufficient documentation

## 2020-04-21 DIAGNOSIS — IMO0002 Reserved for concepts with insufficient information to code with codable children: Secondary | ICD-10-CM | POA: Insufficient documentation

## 2020-04-21 DIAGNOSIS — I4891 Unspecified atrial fibrillation: Secondary | ICD-10-CM | POA: Insufficient documentation

## 2020-04-21 HISTORY — DX: Type 2 diabetes mellitus without complications: E11.9

## 2020-04-27 ENCOUNTER — Ambulatory Visit: Payer: 59 | Admitting: Cardiology

## 2020-05-07 ENCOUNTER — Ambulatory Visit: Payer: 59 | Admitting: Cardiology

## 2020-05-17 ENCOUNTER — Other Ambulatory Visit: Payer: Self-pay

## 2020-05-17 ENCOUNTER — Ambulatory Visit (AMBULATORY_SURGERY_CENTER): Payer: 59 | Admitting: Gastroenterology

## 2020-05-17 ENCOUNTER — Encounter: Payer: Self-pay | Admitting: Gastroenterology

## 2020-05-17 VITALS — BP 111/78 | HR 74 | Resp 12 | Ht 70.0 in | Wt 221.0 lb

## 2020-05-17 DIAGNOSIS — K222 Esophageal obstruction: Secondary | ICD-10-CM

## 2020-05-17 DIAGNOSIS — R1310 Dysphagia, unspecified: Secondary | ICD-10-CM

## 2020-05-17 DIAGNOSIS — K31A19 Gastric intestinal metaplasia without dysplasia, unspecified site: Secondary | ICD-10-CM

## 2020-05-17 DIAGNOSIS — K319 Disease of stomach and duodenum, unspecified: Secondary | ICD-10-CM | POA: Diagnosis not present

## 2020-05-17 DIAGNOSIS — K219 Gastro-esophageal reflux disease without esophagitis: Secondary | ICD-10-CM

## 2020-05-17 DIAGNOSIS — K2970 Gastritis, unspecified, without bleeding: Secondary | ICD-10-CM

## 2020-05-17 MED ORDER — SODIUM CHLORIDE 0.9 % IV SOLN: 500.0000 mL | INTRAVENOUS | Status: DC

## 2020-05-17 MED ORDER — DEXTROSE 5 % IV SOLN: INTRAVENOUS | Status: AC

## 2020-05-17 NOTE — Patient Instructions (Signed)
YOU HAD AN ENDOSCOPIC PROCEDURE TODAY AT THE [facility]:   Refer to the procedure report that was given to you for any specific questions about what was found during the examination.  If the procedure report does not answer your questions, please call your gastroenterologist to clarify.  If you requested that your care partner not be given the details of your procedure findings, then the procedure report has been included in a sealed envelope for you to review at your convenience later.  YOU SHOULD EXPECT: Some feelings of bloating in the abdomen. Passage of more gas than usual.  Walking can help get rid of the air that was put into your GI tract during the procedure and reduce the bloating. If you had a lower endoscopy (such as a colonoscopy or flexible sigmoidoscopy) you may notice spotting of blood in your stool or on the toilet paper. If you underwent a bowel prep for your procedure, you may not have a normal bowel movement for a few days.  Please Note:  You might notice some irritation and congestion in your nose or some drainage.  This is from the oxygen used during your procedure.  There is no need for concern and it should clear up in a day or so.  SYMPTOMS TO REPORT IMMEDIATELY:   Following lower endoscopy (colonoscopy or flexible sigmoidoscopy):  Excessive amounts of blood in the stool  Significant tenderness or worsening of abdominal pains  Swelling of the abdomen that is new, acute  Fever of 100F or higher   Following upper endoscopy (EGD)  Vomiting of blood or coffee ground material  New chest pain or pain under the shoulder blades  Painful or persistently difficult swallowing  New shortness of breath  Fever of 100F or higher  Black, tarry-looking stools  For urgent or emergent issues, a gastroenterologist can be reached at any hour by calling [phone]. Do not use MyChart messaging for urgent concerns.    DIET:  We do recommend a small meal at first, but  then you may proceed to your regular diet.  Drink plenty of fluids but you should avoid alcoholic beverages for 24 hours.  ACTIVITY:  You should plan to take it easy for the rest of today and you should NOT DRIVE or use heavy machinery until tomorrow (because of the sedation medicines used during the test).    FOLLOW UP: Our staff will call the number listed on your records 48-72 hours following your procedure to check on you and address any questions or concerns that you may have regarding the information given to you following your procedure. If we do not reach you, we will leave a message.  We will attempt to reach you two times.  During this call, we will ask if you have developed any symptoms of COVID 19. If you develop any symptoms (ie: fever, flu-like symptoms, shortness of breath, cough etc.) before then, please call [phone].  If you test positive for Covid 19 in the 2 weeks post procedure, please call and report this information to us.    If any biopsies were taken you will be contacted by phone or by letter within the next 1-3 weeks.  Please call us at [phone] if you have not heard about the biopsies in 3 weeks.    SIGNATURES/CONFIDENTIALITY: You and/or your care partner have signed paperwork which will be entered into your electronic medical record.  These signatures attest to the fact that that the information above on   your After Visit Summary has been reviewed and is understood.  Full responsibility of the confidentiality of this discharge information lies with you and/or your care-partner. 

## 2020-05-17 NOTE — Op Note (Signed)
[facility] Patient Name: Dwayne Silva Procedure Date: 05/17/2020 11:17 AM MRN: [id number] Endoscopist: Jackquline Denmark , MD Age: 55 Referring MD:  Date of Birth: [birth year] Gender: Male Account #: [id number] Procedure:                Upper GI endoscopy Indications:              Dysphagia Medicines:                Monitored Anesthesia Care Procedure:                Pre-Anesthesia Assessment:                           - Prior to the procedure, a History and Physical                            was performed, and patient medications and                            allergies were reviewed. The patient's tolerance of                            previous anesthesia was also reviewed. The risks                            and benefits of the procedure and the sedation                            options and risks were discussed with the patient.                            All questions were answered, and informed consent                            was obtained. Prior Anticoagulants: The patient has                            taken Xarelto (rivaroxaban), last dose was 2 days                            prior to procedure. ASA Grade Assessment: III - A                            patient with severe systemic disease. After                            reviewing the risks and benefits, the patient was                            deemed in satisfactory condition to undergo the                            procedure.  After obtaining informed consent, the endoscope was                            passed under direct vision. Throughout the                            procedure, the patient's blood pressure, pulse, and                            oxygen saturations were monitored continuously. The                            Endoscope was introduced through the mouth, and                            advanced to the second part of duodenum. The upper                             GI endoscopy was accomplished without difficulty.                            The patient tolerated the procedure well. Scope In: Scope Out: Findings:                 No endoscopic abnormality was evident in the                            esophagus to explain the patient's complaint of                            dysphagia. It was decided, however, to proceed with                            dilation of the entire esophagus. The scope was                            withdrawn. Dilation was performed with a Maloney                            dilator with mild resistance at 31 Fr and 54 Fr.                            Biopsies were obtained from the proximal and distal                            esophagus with cold forceps for histology of                            suspected eosinophilic esophagitis.                           Localized mild inflammation characterized by  erosions was found in the gastric antrum. Biopsies                            were taken with a cold forceps for histology.                           The examined duodenum was normal. Complications:            No immediate complications. Estimated Blood Loss:     Estimated blood loss: none. Impression:               - No endoscopic esophageal abnormality to explain                            patient's dysphagia. Esophagus dilated. Dilated.                            Biopsied.                           - Gastritis. Biopsied.                           - Normal examined duodenum. Recommendation:           - Patient has a contact number available for                            emergencies. The signs and symptoms of potential                            delayed complications were discussed with the                            patient. Return to normal activities tomorrow.                            Written discharge instructions were provided to the                            patient.                            - Post dilatation diet.                           - Continue present medications including Dexilant                            60 mg p.o. once a day.                           - Resume Xarelto (rivaroxaban) at prior dose in 2                            days.                           -  Await pathology results.                           - Avoid ibuprofen, naproxen, or other non-steroidal                            anti-inflammatory drugs.                           - The findings and recommendations were discussed                            with the patient's family. Jackquline Denmark, MD 05/17/2020 11:35:55 AM This report has been signed electronically.

## 2020-05-17 NOTE — Progress Notes (Signed)
Called to room to assist during endoscopic procedure.  Patient ID and intended procedure confirmed with present staff. Received instructions for my participation in the procedure from the performing physician.  

## 2020-05-17 NOTE — Progress Notes (Signed)
pt tolerated well. VSS. awake and to recovery. Report given to RN. Oral bite block placed by Tech and removed by [employer] No trauma.

## 2020-05-19 ENCOUNTER — Telehealth: Payer: Self-pay | Admitting: *Deleted

## 2020-05-19 NOTE — Telephone Encounter (Signed)
1. Have you developed a fever since your procedure? no  2.   Have you had an respiratory symptoms (SOB or cough) since your procedure? no  3.   Have you tested positive for COVID 19 since your procedure no  4.   Have you had any family members/close contacts diagnosed with the COVID 19 since your procedure?  no   If yes to any of these questions please route to Joylene John, RN and Joella Prince, RN Follow up Call-  Call back number 05/17/2020 02/21/2018  Post procedure Call Back phone  # [phone]  Permission to leave phone message Yes Yes  Some recent data might be hidden     Patient questions:  Do you have a fever, pain , or abdominal swelling? Yes.   Pain Score  10 *  Have you tolerated food without any problems? No.  Have you been able to return to your normal activities? No.  Do you have any questions about your discharge instructions: Diet   No. Medications  No. Follow up visit  No.  Do you have questions or concerns about your Care? No.  Actions: * If pain score is 4 or above:Spoke with pt's wife on f/u call,patient was asleep .Wife says pt was having pain in stomach all day yesterday which she rated as 10 on the pain scale . Pt did not want to eat all day .Pt's wife denied pt having any bleeding ,chest pain ,or shortness of breath. Informed pt's wife that I would send this message to Dr Lyndel Safe. Physician/ provider Notified : Jackquline Denmark, MD  .

## 2020-05-19 NOTE — Telephone Encounter (Signed)
Have tried calling him several times. I also tried calling his wife-the phone no listed his business phone.  No answering machine   Dwayne Silva, If he is hurting that bad, he needs to come over to ED at [facility] be on the safer side He does have esophageal spasms previously.  RG

## 2020-05-19 NOTE — Telephone Encounter (Signed)
Unable to reach patient or his wife regarding stomach pain. Tried to call all the numbers listed in his chart.

## 2020-06-09 ENCOUNTER — Encounter: Payer: Self-pay | Admitting: Gastroenterology

## 2020-09-17 HISTORY — PX: BRAIN SURGERY: SHX531

## 2020-09-25 DIAGNOSIS — I2510 Atherosclerotic heart disease of native coronary artery without angina pectoris: Secondary | ICD-10-CM

## 2020-09-25 DIAGNOSIS — S065XAA Traumatic subdural hemorrhage with loss of consciousness status unknown, initial encounter: Secondary | ICD-10-CM

## 2020-09-25 HISTORY — DX: Atherosclerotic heart disease of native coronary artery without angina pectoris: I25.10

## 2020-09-25 HISTORY — DX: Traumatic subdural hemorrhage with loss of consciousness status unknown, initial encounter: S06.5XAA

## 2020-10-26 DIAGNOSIS — Z91199 Patient's noncompliance with other medical treatment and regimen due to unspecified reason: Secondary | ICD-10-CM | POA: Insufficient documentation

## 2020-10-26 HISTORY — DX: Patient's noncompliance with other medical treatment and regimen due to unspecified reason: Z91.199

## 2020-10-31 DIAGNOSIS — Z8679 Personal history of other diseases of the circulatory system: Secondary | ICD-10-CM | POA: Insufficient documentation

## 2020-10-31 DIAGNOSIS — I6200 Nontraumatic subdural hemorrhage, unspecified: Secondary | ICD-10-CM

## 2020-10-31 HISTORY — DX: Nontraumatic subdural hemorrhage, unspecified: I62.00

## 2020-10-31 HISTORY — DX: Personal history of other diseases of the circulatory system: Z86.79

## 2021-02-16 ENCOUNTER — Telehealth: Payer: Self-pay

## 2021-02-16 NOTE — Telephone Encounter (Signed)
I have called and left a voicemail for patient to call back and schedule an appointment with pre-visit for recall colonoscopy.

## 2021-03-23 ENCOUNTER — Other Ambulatory Visit: Payer: Self-pay | Admitting: Gastroenterology

## 2021-04-11 ENCOUNTER — Encounter: Payer: Self-pay | Admitting: *Deleted

## 2021-04-19 ENCOUNTER — Encounter: Payer: Self-pay | Admitting: *Deleted

## 2021-04-22 ENCOUNTER — Telehealth: Payer: Self-pay | Admitting: Cardiology

## 2021-04-22 NOTE — Telephone Encounter (Signed)
Pt c/o medication issue:  1. Name of Medication: xarelto  2. How are you currently taking this medication (dosage and times per day)? Not currently taking  3. Are you having a reaction (difficulty breathing--STAT)? no  4. What is your medication issue? Patient states he had brain surgery and was told to stop his blood thinner.

## 2021-04-26 ENCOUNTER — Other Ambulatory Visit: Payer: Self-pay

## 2021-04-26 DIAGNOSIS — E1165 Type 2 diabetes mellitus with hyperglycemia: Secondary | ICD-10-CM | POA: Insufficient documentation

## 2021-04-26 DIAGNOSIS — E559 Vitamin D deficiency, unspecified: Secondary | ICD-10-CM | POA: Insufficient documentation

## 2021-04-26 DIAGNOSIS — E291 Testicular hypofunction: Secondary | ICD-10-CM | POA: Insufficient documentation

## 2021-05-03 ENCOUNTER — Encounter: Payer: Self-pay | Admitting: Gastroenterology

## 2021-06-22 DIAGNOSIS — M1712 Unilateral primary osteoarthritis, left knee: Secondary | ICD-10-CM | POA: Insufficient documentation

## 2021-06-22 HISTORY — DX: Unilateral primary osteoarthritis, left knee: M17.12

## 2021-06-23 ENCOUNTER — Emergency Department (HOSPITAL_COMMUNITY)
Admission: EM | Admit: 2021-06-23 | Discharge: 2021-06-24 | Disposition: A | Discharge status: Home / Self Care | Payer: BLUE CROSS/BLUE SHIELD | Attending: Emergency Medicine | Admitting: Emergency Medicine

## 2021-06-23 ENCOUNTER — Encounter (HOSPITAL_COMMUNITY): Payer: Self-pay

## 2021-06-23 ENCOUNTER — Emergency Department (HOSPITAL_COMMUNITY): Payer: BLUE CROSS/BLUE SHIELD

## 2021-06-23 ENCOUNTER — Other Ambulatory Visit: Payer: Self-pay

## 2021-06-23 DIAGNOSIS — Z7984 Long term (current) use of oral hypoglycemic drugs: Secondary | ICD-10-CM | POA: Insufficient documentation

## 2021-06-23 DIAGNOSIS — M25512 Pain in left shoulder: Secondary | ICD-10-CM | POA: Insufficient documentation

## 2021-06-23 DIAGNOSIS — S161XXA Strain of muscle, fascia and tendon at neck level, initial encounter: Secondary | ICD-10-CM | POA: Diagnosis not present

## 2021-06-23 DIAGNOSIS — I4891 Unspecified atrial fibrillation: Secondary | ICD-10-CM | POA: Diagnosis not present

## 2021-06-23 DIAGNOSIS — G44209 Tension-type headache, unspecified, not intractable: Secondary | ICD-10-CM | POA: Diagnosis not present

## 2021-06-23 DIAGNOSIS — S29012A Strain of muscle and tendon of back wall of thorax, initial encounter: Secondary | ICD-10-CM | POA: Insufficient documentation

## 2021-06-23 DIAGNOSIS — S299XXA Unspecified injury of thorax, initial encounter: Secondary | ICD-10-CM | POA: Diagnosis present

## 2021-06-23 DIAGNOSIS — I10 Essential (primary) hypertension: Secondary | ICD-10-CM | POA: Insufficient documentation

## 2021-06-23 DIAGNOSIS — T148XXA Other injury of unspecified body region, initial encounter: Secondary | ICD-10-CM

## 2021-06-23 DIAGNOSIS — E1165 Type 2 diabetes mellitus with hyperglycemia: Secondary | ICD-10-CM | POA: Diagnosis not present

## 2021-06-23 DIAGNOSIS — I2510 Atherosclerotic heart disease of native coronary artery without angina pectoris: Secondary | ICD-10-CM | POA: Insufficient documentation

## 2021-06-23 DIAGNOSIS — X58XXXA Exposure to other specified factors, initial encounter: Secondary | ICD-10-CM | POA: Diagnosis not present

## 2021-06-23 DIAGNOSIS — Z79899 Other long term (current) drug therapy: Secondary | ICD-10-CM | POA: Diagnosis not present

## 2021-06-23 LAB — URINALYSIS, ROUTINE W REFLEX MICROSCOPIC
Bilirubin Urine: NEGATIVE
Glucose, UA: NEGATIVE mg/dL
Hgb urine dipstick: NEGATIVE
Ketones, ur: NEGATIVE mg/dL
Leukocytes,Ua: NEGATIVE
Nitrite: NEGATIVE
Protein, ur: NEGATIVE mg/dL
Specific Gravity, Urine: 1.003 — ABNORMAL LOW (ref 1.005–1.030)
pH: 6 (ref 5.0–8.0)

## 2021-06-23 LAB — DIFFERENTIAL
Abs Immature Granulocytes: 0.09 10*3/uL — ABNORMAL HIGH (ref 0.00–0.07)
Basophils Absolute: 0.1 10*3/uL (ref 0.0–0.1)
Basophils Relative: 1 %
Eosinophils Absolute: 0.1 10*3/uL (ref 0.0–0.5)
Eosinophils Relative: 1 %
Immature Granulocytes: 1 %
Lymphocytes Relative: 25 %
Lymphs Abs: 2.5 10*3/uL (ref 0.7–4.0)
Monocytes Absolute: 0.7 10*3/uL (ref 0.1–1.0)
Monocytes Relative: 7 %
Neutro Abs: 6.5 10*3/uL (ref 1.7–7.7)
Neutrophils Relative %: 65 %

## 2021-06-23 LAB — COMPREHENSIVE METABOLIC PANEL
ALT: 29 U/L (ref 0–44)
AST: 25 U/L (ref 15–41)
Albumin: 3.8 g/dL (ref 3.5–5.0)
Alkaline Phosphatase: 79 U/L (ref 38–126)
Anion gap: 10 (ref 5–15)
BUN: 7 mg/dL (ref 6–20)
CO2: 23 mmol/L (ref 22–32)
Calcium: 9.2 mg/dL (ref 8.9–10.3)
Chloride: 103 mmol/L (ref 98–111)
Creatinine, Ser: 0.83 mg/dL (ref 0.61–1.24)
GFR, Estimated: >60 mL/min (ref >60)
Glucose, Bld: 245 mg/dL — ABNORMAL HIGH (ref 70–99)
Potassium: 4.4 mmol/L (ref 3.5–5.1)
Sodium: 136 mmol/L (ref 135–145)
Total Bilirubin: <0.1 mg/dL — ABNORMAL LOW (ref 0.3–1.2)
Total Protein: 7.3 g/dL (ref 6.5–8.1)

## 2021-06-23 LAB — CBC
HCT: 44 % (ref 39.0–52.0)
Hemoglobin: 14.8 g/dL (ref 13.0–17.0)
MCH: 29.7 pg (ref 26.0–34.0)
MCHC: 33.6 g/dL (ref 30.0–36.0)
MCV: 88.2 fL (ref 80.0–100.0)
Platelets: 214 10*3/uL (ref 150–400)
RBC: 4.99 MIL/uL (ref 4.22–5.81)
RDW: 14.8 % (ref 11.5–15.5)
WBC: 10 10*3/uL (ref 4.0–10.5)
nRBC: 0 % (ref 0.0–0.2)

## 2021-06-23 LAB — PROTIME-INR
INR: 0.9 (ref 0.8–1.2)
Prothrombin Time: 12.2 seconds (ref 11.4–15.2)

## 2021-06-23 LAB — TROPONIN I (HIGH SENSITIVITY): Troponin I (High Sensitivity): 5 ng/L (ref <18)

## 2021-06-23 LAB — APTT: aPTT: 28 seconds (ref 24–36)

## 2021-06-23 LAB — I-STAT CHEM 8, ED
BUN: 8 mg/dL (ref 6–20)
Calcium, Ion: 1.17 mmol/L (ref 1.15–1.40)
Chloride: 101 mmol/L (ref 98–111)
Creatinine, Ser: 0.7 mg/dL (ref 0.61–1.24)
Glucose, Bld: 240 mg/dL — ABNORMAL HIGH (ref 70–99)
HCT: 47 % (ref 39.0–52.0)
Hemoglobin: 16 g/dL (ref 13.0–17.0)
Potassium: 4.4 mmol/L (ref 3.5–5.1)
Sodium: 138 mmol/L (ref 135–145)
TCO2: 25 mmol/L (ref 22–32)

## 2021-06-23 MED ORDER — SODIUM CHLORIDE 0.9% FLUSH: 3.0000 mL | Freq: Once | INTRAVENOUS | Status: DC

## 2021-06-23 NOTE — ED Provider Triage Note (Signed)
Emergency Medicine Provider Triage Evaluation Note ? ?Dwayne Silva , a 56 y.o. male  was evaluated in triage.  Pt complains of headache and left-sided neck pain onset 5 days.  He has associated dizziness and blurry vision.  Patient was evaluated by his primary care provider today and sent to the emergency department to rule out intracranial hemorrhage.  Patient has a past medical history of approximately 3 head bleeds and he is followed by neurology.  Patient has left-sided facial droop however patient's wife notes that this is normal for him following his many head bleeds that he has had.  Patient denies residual weakness from his previous head bleeds.  Denies shortness of breath.  Patient has associated chest pain.  ? ?Review of Systems  ?Positive: As per HPI above ?Negative:  ? ?Physical Exam  ?BP (!) 144/91   Pulse 97   Temp 98.3 ?F (36.8 ?C) (Oral)   Resp 16   SpO2 97%  ?Gen:   Awake, no distress   ?Resp:  Normal effort  ?MSK:   Moves extremities without difficulty  ?Other:  Cranial nerves II through XII intact.  Left facial droop noted on exam (normal per patient wife following previous head bleeds).  Negative pronator drift.  Strength and sensation intact to bilateral upper and lower extremities.  Decreased sensation noted to left cheek and jaw. ? ?Medical Decision Making  ?Medically screening exam initiated at 7:51 PM.  Appropriate orders placed.  Justyn Mccartin was informed that the remainder of the evaluation will be completed by another provider, this initial triage assessment does not replace that evaluation, and the importance of remaining in the ED until their evaluation is complete. ? ?8:01 PM -discussed patient case with attending, Dr. Laverta Baltimore who agrees with obtaining a CT head without contrast as well as labs to rule out intracranial abnormality. ? ?8:01 PM - Discussed with RN that patient is in need of a room immediately. RN aware and working on room placement.  ?  ?Dede Dobesh A,  PA-C ?06/23/21 2001 ? ?

## 2021-06-23 NOTE — ED Notes (Signed)
Pt now reporting chest pain. PA aware. Troponin added on to lab work. ?

## 2021-06-23 NOTE — ED Triage Notes (Signed)
Pt reports pain to left side of his neck and headache that radiates to his left shoulder, onset 5 days ago. He also reports dizziness and blurry vision. He was sent here by his doctor to r/o intracranial hemorrhage, hx of head bleeds. Left sided facial droop noticed in triage but wife states this is normal for him.  ?

## 2021-06-23 NOTE — ED Notes (Signed)
Charge RN informed pt will need to go back to the next available room. ?

## 2021-06-24 LAB — TROPONIN I (HIGH SENSITIVITY): Troponin I (High Sensitivity): 4 ng/L (ref <18)

## 2021-06-24 MED ORDER — KETOROLAC TROMETHAMINE 60 MG/2ML IM SOLN
15.0000 mg | Freq: Once | INTRAMUSCULAR | Status: AC
Start: 2021-06-24 — End: 2021-06-24
  Administered 2021-06-24: 15 mg via INTRAMUSCULAR
  Filled 2021-06-24: qty 2

## 2021-06-24 NOTE — ED Provider Notes (Signed)
?[facility] ?Provider Note ? ?CSN: [id number] ?Arrival date & time: 06/23/21 1924 ? ?Chief Complaint(s) ?Headache ? ?HPI ?Dwayne Silva is a 56 y.o. male with a past medical history listed below including prior subdural hematoma related to anticoagulation, chronic neck, back and shoulder pain presents to the emergency department for several days of worsening of his chronic back pain worse on the left parascapular region as well as left-sided neck which shoots up/radiates into his head.  Pain is worse with certain positions and movement.  Pain does improve with massaging and heat. He endorses paresthesias of the left upper extremity.  Denies any weakness.  Patient seen by PCP and sent to rule out intracranial bleed. He denies any fall or trauma.  Patient is no longer anticoagulated. ? ?The history is provided by the patient.  ? ?Past Medical History ?Past Medical History:  ?Diagnosis Date  ? Abdominal pain, LLQ (left lower quadrant)   ? Abnormal nuclear stress test 10/23/2019  ? Acute bilateral ankle pain 09/05/2018  ? Angina pectoris ([place]) 10/23/2019  ? Anxiety   ? Atrial fibrillation ([place])   ? a. New onset 01/2013, spontaneously converted to sinus during hosp eval at [facility].  ? Benign prostatic hyperplasia with weak urinary stream 10/24/2018  ? Chest pain   ? a. Neg cath at [facility] 07/2007. b. Neg nuc at [facility] 2010. c. LHC 2012 at [facility] - mod, diffuse coronary vasospasm of distal RCA and RPL, mild nonobst CAD (40% Cx), normal EF. d. Normal nuc at [facility] 01/2013. Echo 01/2013: EF >55, normal LV, normal diastolic function, no pulm HTN, RV normal, no valvular abnormalities, neg CTA for PE.  ? Chronic bilateral low back pain without sciatica 06/23/2015  ? Chronic insomnia   ? Chronic left shoulder pain 04/18/2019  ? Chronic pain of both shoulders 06/15/2016  ? Chronic pain of left knee 06/15/2016  ? Coronary artery disease 09/25/2020  ? Coronary vasospasm (HCC)   ? a.  Suggested by [place] at [facility] in 2012.  ? Cyst of sphenoid sinus 05/19/2015  ? Depressed 07/15/2017  ? Diabetes mellitus ([place]) 04/21/2020  ? Diabetes mellitus due to underlying condition with unspecified complications ([place]) 59/93/5701  ? Dyslipidemia 02/04/2015  ? Dyspepsia   ? Essential hypertension 02/04/2015  ? Fatty liver   ? Flank pain 08/08/2018  ? Gastro-esophageal reflux disease with esophagitis 07/15/2017  ? GERD (gastroesophageal reflux disease)   ? H/O subdural hemorrhage 10/31/2020  ? High cholesterol 07/15/2017  ? Hypercholesterolemia   ? Hyperlipidemia   ? Hypertriglyceridemia   ? IBS (irritable bowel syndrome)   ? Neck pain 06/23/2015  ? Noncompliance with diabetes treatment 10/26/2020  ? Obesity   ? Oropharyngeal dysphagia   ? Paroxysmal atrial fibrillation ([place]) 02/04/2015  ? Pre-diabetes   ? Psychophysiological insomnia 10/24/2018  ? S/P left knee arthroscopy 03/01/2017  ? Sphenoidal sinus polyp 05/19/2015  ? Spondylosis of cervical region without myelopathy or radiculopathy 05/19/2015  ? Sprain and strain of deltoid (ligament) of ankle 09/05/2018  ? Subdural bleeding ([place]) 10/31/2020  ? Testicular hypofunction   ? Therapeutic opioid induced constipation 12/21/2019  ? Traumatic subdural hemorrhage with loss of consciousness status unknown, initial encounter ([place]) 09/25/2020  ? Type 2 diabetes mellitus with hyperglycemia (HCC)   ? Type 2 diabetes mellitus without complications ([place]) 77/93/9030  ? Uncontrolled type 2 diabetes mellitus   ? Vasospastic angina (HCC)   ? Vitamin D deficiency   ? ?Patient Active Problem List  ? Diagnosis  Date Noted  ? 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 Type 2 diabetes mellitus with hyperglycemia ([place]) 04/26/2021  ? Vitamin D deficiency 04/26/2021  ? H/O subdural hemorrhage 10/31/2020  ? Subdural bleeding ([place]) 10/31/2020  ? Noncompliance with diabetes treatment 10/26/2020  ? Traumatic subdural hemorrhage with loss of consciousness status unknown, initial encounter ([place])  09/25/2020  ? Coronary artery disease 09/25/2020  ? Diabetes mellitus ([place]) 04/21/2020  ? Abdominal pain, LLQ (left lower quadrant)   ? Anxiety   ? Atrial fibrillation ([place])   ? Chronic insomnia   ? Coronary vasospasm (HCC)   ? Dyspepsia   ? Fatty liver   ? Hypercholesterolemia   ? Hypertriglyceridemia   ? IBS (irritable bowel syndrome)   ? Oropharyngeal dysphagia   ? Pre-diabetes   ? Uncontrolled type 2 diabetes mellitus   ? Vasospastic angina (HCC)   ? Therapeutic opioid induced constipation 12/21/2019  ? Abnormal nuclear stress test 10/23/2019  ? Angina pectoris ([place]) 10/23/2019  ? Chronic left shoulder pain 04/18/2019  ? Benign prostatic hyperplasia with weak urinary stream 10/24/2018  ? Psychophysiological insomnia 10/24/2018  ? Acute bilateral ankle pain 09/05/2018  ? Sprain and strain of deltoid (ligament) of ankle 09/05/2018  ? Flank pain 08/08/2018  ? Depressed 07/15/2017  ? Gastro-esophageal reflux disease with esophagitis 07/15/2017  ? High cholesterol 07/15/2017  ? S/P left knee arthroscopy 03/01/2017  ? Chronic pain of both shoulders 06/15/2016  ? Chronic pain of left knee 06/15/2016  ? Diabetes mellitus due to underlying condition with unspecified complications ([place]) 94/85/4627  ? Chronic bilateral low back pain without sciatica 06/23/2015  ? Neck pain 06/23/2015  ? Cyst of sphenoid sinus 05/19/2015  ? Sphenoidal sinus polyp 05/19/2015  ? Spondylosis of cervical region without myelopathy or radiculopathy 05/19/2015  ? Paroxysmal atrial fibrillation ([place]) 02/04/2015  ? Essential hypertension 02/04/2015  ? Dyslipidemia 02/04/2015  ? Type 2 diabetes mellitus without complications ([place]) 03/50/0938  ? Chest pain 06/07/2013  ? Hyperlipidemia 06/07/2013  ? GERD (gastroesophageal reflux disease) 06/07/2013  ? Obesity 06/07/2013  ? ?Home Medication(s) ?Prior to Admission medications   ?Medication Sig Start Date End Date Taking? Authorizing Provider  ?albuterol (VENTOLIN HFA) 108 (90 Base) MCG/ACT inhaler Inhale  2 puffs into the lungs every 4 (four) hours as needed for wheezing or shortness of breath. 08/08/19   [provider]  ?amitriptyline (ELAVIL) 25 MG tablet Take 100 mg by mouth at bedtime. 03/29/20   [provider]  ?cetirizine (ZYRTEC) 10 MG tablet Take 10 mg by mouth at bedtime. 02/26/21   [provider]  ?cyclobenzaprine (FLEXERIL) 10 MG tablet Take 10 mg by mouth 3 (three) times daily as needed for muscle spasms. 07/10/19   [provider]  ?dicyclomine (BENTYL) 10 MG capsule Take 10 mg by mouth 4 (four) times daily -  before meals and at bedtime. 08/20/17   [provider]  ?diltiazem (TIAZAC) 360 MG 24 hr capsule Take 360 mg by mouth daily. 09/09/15   [provider]  ?divalproex (DEPAKOTE) 500 MG DR tablet Take 1,000 mg by mouth daily.    [provider]  ?docusate sodium (COLACE) 100 MG capsule Take 100 mg by mouth daily.    [provider]  ?escitalopram (LEXAPRO) 20 MG tablet Take 20 mg by mouth daily. 01/03/20   [provider]  ?esomeprazole (NEXIUM) 40 MG capsule Take 40 mg by mouth daily. 10/27/20   [provider]  ?famotidine (PEPCID) 40 MG tablet TAKE 1 TABLET BY MOUTH  EVERY DAY 03/23/21   Jackquline Denmark, MD  ?[place] 10 MG TABS tablet Take 10 mg by mouth daily. 05/30/17   [provider]  ?fluticasone (FLONASE) 50 MCG/ACT nasal spray Place 1 spray into both nostrils daily. 08/13/19   [provider]  ?glipiZIDE (GLUCOTROL) 5 MG tablet Take 5 mg by mouth 2 (two) times daily. 12/23/20   [provider]  ?isosorbide mononitrate (IMDUR) 30 MG 24 hr tablet Take 30 mg by mouth daily. 10/20/19   [provider]  ?JARDIANCE 25 MG TABS tablet Take 25 mg by mouth daily. 04/21/21   [provider]  ?Magnesium 400 MG TABS Take 400 mg by mouth daily.    [provider]  ?meclizine (ANTIVERT) 25 MG tablet Take 25 mg by mouth 3 (three) times daily as needed for dizziness.    [provider]  ?meclizine (ANTIVERT) 25 MG tablet Take 25 mg by mouth 3 (three) times daily as needed for dizziness.    [provider]  ?metFORMIN (GLUCOPHAGE) 1000 MG tablet Take 1,000 mg by mouth 2 (

## 2021-06-24 NOTE — ED Notes (Signed)
Patient verbalizes understanding of d/c instructions. Opportunities for questions and answers were provided. Pt d/c from ED and ambulated to lobby with wife.  

## 2021-06-24 NOTE — Discharge Instructions (Signed)
You may use over-the-counter Motrin (Ibuprofen), Acetaminophen (Tylenol), topical muscle creams such as SalonPas, Icy Hot, Bengay, etc. Please stretch, apply ice or heat (whichever helps), and have massage therapy for additional assistance.  

## 2021-07-21 ENCOUNTER — Other Ambulatory Visit: Payer: Self-pay

## 2021-07-22 ENCOUNTER — Ambulatory Visit: Payer: Self-pay | Admitting: Cardiology

## 2021-07-25 ENCOUNTER — Ambulatory Visit (INDEPENDENT_AMBULATORY_CARE_PROVIDER_SITE_OTHER): Payer: BLUE CROSS/BLUE SHIELD | Admitting: Cardiology

## 2021-07-25 ENCOUNTER — Encounter: Payer: Self-pay | Admitting: Cardiology

## 2021-07-25 VITALS — BP 142/82 | HR 101 | Ht 69.5 in | Wt 221.0 lb

## 2021-07-25 DIAGNOSIS — I10 Essential (primary) hypertension: Secondary | ICD-10-CM | POA: Diagnosis not present

## 2021-07-25 DIAGNOSIS — E088 Diabetes mellitus due to underlying condition with unspecified complications: Secondary | ICD-10-CM

## 2021-07-25 DIAGNOSIS — I480 Paroxysmal atrial fibrillation: Secondary | ICD-10-CM | POA: Diagnosis not present

## 2021-07-25 DIAGNOSIS — Z01810 Encounter for preprocedural cardiovascular examination: Secondary | ICD-10-CM

## 2021-07-25 DIAGNOSIS — I2510 Atherosclerotic heart disease of native coronary artery without angina pectoris: Secondary | ICD-10-CM | POA: Diagnosis not present

## 2021-07-25 DIAGNOSIS — E7800 Pure hypercholesterolemia, unspecified: Secondary | ICD-10-CM | POA: Diagnosis not present

## 2021-07-25 NOTE — Patient Instructions (Signed)
Medication Instructions:  ?Your physician recommends that you continue on your current medications as directed. Please refer to the Current Medication list given to you today. ? ?*If you need a refill on your cardiac medications before your next appointment, please call your pharmacy* ? ? ?Lab Work: ?None ordered ?If you have labs (blood work) drawn today and your tests are completely normal, you will receive your results only by: ?MyChart Message (if you have MyChart) OR ?A paper copy in the mail ?If you have any lab test that is abnormal or we need to change your treatment, we will call you to review the results. ? ? ?Testing/Procedures: ?Your physician has requested that you have a lexiscan myoview. For further information please visit [email or web address]. Please follow instruction sheet, as given. ? ?The test will take approximately 3 to 4 hours to complete; you may bring reading material.  If someone comes with you to your appointment, they will need to remain in the main lobby due to limited space in the testing area. **If you are pregnant or breastfeeding, please notify the nuclear lab prior to your appointment** ? ?How to prepare for your Myocardial Perfusion Test: ?Do not eat or drink 3 hours prior to your test, except you may have water. ?Do not consume products containing caffeine (regular or decaffeinated) 12 hours prior to your test. (ex: coffee, chocolate, sodas, tea). ?Do bring a list of your current medications with you.  If not listed below, you may take your medications as normal. ?Do wear comfortable clothes (no dresses or overalls) and walking shoes, tennis shoes preferred (No heels or open toe shoes are allowed). ?Do NOT wear cologne, perfume, aftershave, or lotions (deodorant is allowed). ?If these instructions are not followed, your test will have to be rescheduled. ? ?Your physician has requested that you have an echocardiogram. Echocardiography is a painless test that uses sound waves to  create images of your heart. It provides your doctor with information about the size and shape of your heart and how well your heart?s chambers and valves are working. This procedure takes approximately one hour. There are no restrictions for this procedure. ? ? ?Follow-Up: ?At [facility], you and your health needs are our priority.  As part of our continuing mission to provide you with exceptional heart care, we have created designated Provider Care Teams.  These Care Teams include your primary Cardiologist (physician) and Advanced Practice Providers (APPs -  Physician Assistants and Nurse Practitioners) who all work together to provide you with the care you need, when you need it. ? ?We recommend signing up for the patient portal called "MyChart".  Sign up information is provided on this After Visit Summary.  MyChart is used to connect with patients for Virtual Visits (Telemedicine).  Patients are able to view lab/test results, encounter notes, upcoming appointments, etc.  Non-urgent messages can be sent to your provider as well.   ?To learn more about what you can do with MyChart, go to [email or web address].   ? ?Your next appointment:   ?6 month(s) ? ?The format for your next appointment:   ?In Person ? ?Provider:   ?Jyl Heinz, MD ? ? ?Other Instructions ?Cardiac Nuclear Scan ?A cardiac nuclear scan is a test that is done to check the flow of blood to your heart. It is done when you are resting and when you are exercising. The test looks for problems such as: ?Not enough blood reaching a portion of the heart. ?The heart muscle not working as  it should. ?You may need this test if: ?You have heart disease. ?You have had lab results that are not normal. ?You have had heart surgery or a balloon procedure to open up blocked arteries (angioplasty). ?You have chest pain. ?You have shortness of breath. ?In this test, a special dye (tracer) is put into your bloodstream. The tracer will travel to your heart. A  camera will then take pictures of your heart to see how the tracer moves through your heart. This test is usually done at a hospital and takes 2-4 hours. ?Tell a doctor about: ?Any allergies you have. ?All medicines you are taking, including vitamins, herbs, eye drops, creams, and over-the-counter medicines. ?Any problems you or family members have had with anesthetic medicines. ?Any blood disorders you have. ?Any surgeries you have had. ?Any medical conditions you have. ?Whether you are pregnant or may be pregnant. ?What are the risks? ?Generally, this is a safe test. However, problems may occur, such as: ?Serious chest pain and heart attack. This is only a risk if the stress portion of the test is done. ?Rapid heartbeat. ?A feeling of warmth in your chest. This feeling usually does not last long. ?Allergic reaction to the tracer. ?What happens before the test? ?Ask your doctor about changing or stopping your normal medicines. This is important. ?Follow instructions from your doctor about what you cannot eat or drink. ?Remove your jewelry on the day of the test. ?What happens during the test? ?An IV tube will be inserted into one of your veins. ?Your doctor will give you a small amount of tracer through the IV tube. ?You will wait for 20-40 minutes while the tracer moves through your bloodstream. ?Your heart will be monitored with an electrocardiogram (ECG). ?You will lie down on an exam table. ?Pictures of your heart will be taken for about 15-20 minutes. ?You may also have a stress test. For this test, one of these things may be done: ?You will be asked to exercise on a treadmill or a stationary bike. ?You will be given medicines that will make your heart work harder. This is done if you are unable to exercise. ?When blood flow to your heart has peaked, a tracer will again be given through the IV tube. ?After 20-40 minutes, you will get back on the exam table. More pictures will be taken of your heart. ?Depending  on the tracer that is used, more pictures may need to be taken 3-4 hours later. ?Your IV tube will be removed when the test is over. ?The test may vary among doctors and hospitals. ?What happens after the test? ?Ask your doctor: ?Whether you can return to your normal schedule, including diet, activities, and medicines. ?Whether you should drink more fluids. This will help to remove the tracer from your body. Drink enough fluid to keep your pee (urine) pale yellow. ?Ask your doctor, or the department that is doing the test: ?When will my results be ready? ?How will I get my results? ?Summary ?A cardiac nuclear scan is a test that is done to check the flow of blood to your heart. ?Tell your doctor whether you are pregnant or may be pregnant. ?Before the test, ask your doctor about changing or stopping your normal medicines. This is important. ?Ask your doctor whether you can return to your normal activities. You may be asked to drink more fluids. ?This information is not intended to replace advice given to you by your health care provider. Make sure you discuss any  questions you have with your health care provider. ?Document Revised: 06/26/2018 Document Reviewed: 08/20/2017 ?Elsevier Patient Education ? [place]. ? ? ? ?Echocardiogram ?An echocardiogram is a test that uses sound waves (ultrasound) to produce images of the heart. ?Images from an echocardiogram can provide important information about: ?Heart size and shape. ?The size and thickness and movement of your heart's walls. ?Heart muscle function and strength. ?Heart valve function or if you have stenosis. Stenosis is when the heart valves are too narrow. ?If blood is flowing backward through the heart valves (regurgitation). ?A tumor or infectious growth around the heart valves. ?Areas of heart muscle that are not working well because of poor blood flow or injury from a heart attack. ?Aneurysm detection. An aneurysm is a weak or damaged part of an  artery wall. The wall bulges out from the normal force of blood pumping through the body. ?Tell a health care provider about: ?Any allergies you have. ?All medicines you are taking, including vitamins, he

## 2021-07-25 NOTE — Progress Notes (Signed)
?Cardiology Office Note:   ? ?Date:  07/25/2021  ? ?ID:  Dwayne Silva, DOB [birth year], MRN [id number] ? ?PCP:  [place]  ?Cardiologist:  Jenean Lindau, MD  ? ?Referring MD: [facility]  ? ? ?ASSESSMENT:   ? ?1. Paroxysmal atrial fibrillation (HCC)   ?2. Essential hypertension   ?3. Hypercholesterolemia   ?4. Coronary artery disease involving native coronary artery of native heart without angina pectoris   ?5. Diabetes mellitus due to underlying condition with unspecified complications ([place])   ? ?PLAN:   ? ?In order of problems listed above: ? ?Primary prevention stressed with the patient.  Importance of compliance with diet medication stressed any vocalized understanding. ?Preop cardiovascular evaluation: Patient has multiple risk factors for coronary artery disease so we will do a Lexiscan sestamibi to assess this.  If this is negative then he is not at high risk for coronary events during the aforementioned surgery.  Medical hemodynamic monitoring will further reduce the risk of coronary events. ?Paroxysmal atrial fibrillation:I discussed with the patient atrial fibrillation, disease process. Management and therapy including rate and rhythm control, anticoagulation benefits and potential risks were discussed extensively with the patient. Patient had multiple questions which were answered to patient's satisfaction.  Currently in sinus rhythm.  He does not want to take anticoagulation because he is too scared of this.  He has had subdural hematoma and he does not therefore want to entertain anticoagulation.  He has been told that it is okay to start anticoagulation but he does not want to do it.  I respect his wishes.  In addition is not very stable on his feet because of bad arthritis of the left knee.  He understands these issues.  He is high risk for falls also. ?Essential hypertension: Blood pressure is mildly elevated.  He tells me that his little anxious and has  whitecoat hypertension.  He checks his blood pressure at home and told me it was fine.   ?Diabetes mellitus and mixed dyslipidemia: Managed by primary care.  They will manage his lipids and such and will need to manage this aspect of his care.  I discussed diet with him at length. ?Patient will be seen in follow-up appointment in 6 months or earlier if the patient has any concerns ? ? ? ?Medication Adjustments/Labs and Tests Ordered: ?Current medicines are reviewed at length with the patient today.  Concerns regarding medicines are outlined above.  ?No orders of the defined types were placed in this encounter. ? ?No orders of the defined types were placed in this encounter. ? ? ? ?No chief complaint on file. ?  ? ?History of Present Illness:   ? ?Dwayne Silva is a 56 y.o. male.  Patient has past medical history of essential hypertension, dyslipidemia, diabetes mellitus.  He had subdural hematoma.  He had multiple surgeries for this.  Currently he is fine.  He is also contemplating surgery on his left knee for arthritis.  This is knee replacement surgery.  Therefore is not very stable on his feet.  At the time of my evaluation, the patient is alert awake oriented and in no distress.  He denies any chest pain orthopnea or PND. ? ?Past Medical History:  ?Diagnosis Date  ? Abdominal pain, LLQ (left lower quadrant)   ? Abnormal nuclear stress test 10/23/2019  ? Acute bilateral ankle pain 09/05/2018  ? Angina pectoris ([place]) 10/23/2019  ? Anxiety   ? Arthritis of left knee 06/22/2021  ?  Formatting of this note might be different from the original. Added automatically from request for surgery [id number]  ? Atrial fibrillation ([place])   ? a. New onset 01/2013, spontaneously converted to sinus during hosp eval at [facility].  ? Benign prostatic hyperplasia with weak urinary stream 10/24/2018  ? Chest pain   ? a. Neg cath at [facility] 07/2007. b. Neg nuc at [facility] 2010. c. LHC 2012 at [facility] - mod, diffuse coronary vasospasm of distal RCA and  RPL, mild nonobst CAD (40% Cx), normal EF. d. Normal nuc at [facility] 01/2013. Echo 01/2013: EF >55, normal LV, normal diastolic function, no pulm HTN, RV normal, no valvular abnormalities, neg CTA for PE.  ? Chronic bilateral low back pain without sciatica 06/23/2015  ? Chronic insomnia   ? Chronic left shoulder pain 04/18/2019  ? Chronic pain of both shoulders 06/15/2016  ? Chronic pain of left knee 06/15/2016  ? Coronary artery disease 09/25/2020  ? Coronary vasospasm (HCC)   ? a. Suggested by [place] at [facility] in 2012.  ? Cyst of sphenoid sinus 05/19/2015  ? Depressed 07/15/2017  ? Diabetes mellitus ([place]) 04/21/2020  ? Diabetes mellitus due to underlying condition with unspecified complications ([place]) 17/00/1749  ? Dyslipidemia 02/04/2015  ? Dyspepsia   ? Essential hypertension 02/04/2015  ? Fatty liver   ? Flank pain 08/08/2018  ? Gastro-esophageal reflux disease with esophagitis 07/15/2017  ? GERD (gastroesophageal reflux disease)   ? H/O subdural hemorrhage 10/31/2020  ? High cholesterol 07/15/2017  ? Hypercholesterolemia   ? Hyperlipidemia   ? Hypertriglyceridemia   ? IBS (irritable bowel syndrome)   ? Neck pain 06/23/2015  ? Noncompliance with diabetes treatment 10/26/2020  ? Obesity   ? Oropharyngeal dysphagia   ? Paroxysmal atrial fibrillation ([place]) 02/04/2015  ? Pre-diabetes   ? Psychophysiological insomnia 10/24/2018  ? S/P left knee arthroscopy 03/01/2017  ? Sphenoidal sinus polyp 05/19/2015  ? Spondylosis of cervical region without myelopathy or radiculopathy 05/19/2015  ? Sprain and strain of deltoid (ligament) of ankle 09/05/2018  ? Subdural bleeding ([place]) 10/31/2020  ? Testicular hypofunction   ? Therapeutic opioid induced constipation 12/21/2019  ? Traumatic subdural hemorrhage with loss of consciousness status unknown, initial encounter ([place]) 09/25/2020  ? Type 2 diabetes mellitus with hyperglycemia (HCC)   ? Type 2 diabetes mellitus without complications ([place]) 44/96/7591  ? Uncontrolled type 2  diabetes mellitus   ? Vasospastic angina (HCC)   ? Vitamin D deficiency   ? ? ?Past Surgical History:  ?Procedure Laterality Date  ? APPENDECTOMY  1985  ? BRAIN SURGERY  09/2020  ? Had to fix a vein still bleeding from previous surgery/ [place]  2017  ? "no problems" per pt done in [facility]  ? CARDIAC CATHETERIZATION  01/2020  ? CHOLECYSTECTOMY  05/2017  ? COLONOSCOPY  07/2005  ? Internal hemorrhoids, otherwise normal exam to the TI. BX: submucosal lymphoid hyperplasia.  ? COLONOSCOPY  07/2001  ? internal hemorrhoids, negative colonoscopy to TI.  ? EXTERNAL EAR SURGERY Left 1990  ? HEMORROIDECTOMY  04/24/2006  ? KNEE SURGERY Left   ? X2 2010 & 2016  ? KNEE SURGERY Right 2010  ? UPPER GI ENDOSCOPY  03/2004  ? Normal  ? UPPER GI ENDOSCOPY  04/07/2016  ? Gastritis. SI Bx: normal duodenal mucosa. Esophagus Bx: Normal Squamous Mucosa.  ? ? ?Current Medications: ?Current Meds  ?Medication Sig  ? albuterol (VENTOLIN HFA) 108 (90 Base) MCG/ACT inhaler Inhale 2  puffs into the lungs every 4 (four) hours as needed for wheezing or shortness of breath.  ? amitriptyline (ELAVIL) 25 MG tablet Take 100 mg by mouth at bedtime.  ? cetirizine (ZYRTEC) 10 MG tablet Take 10 mg by mouth at bedtime.  ? cyclobenzaprine (FLEXERIL) 10 MG tablet Take 10 mg by mouth 3 (three) times daily as needed for muscle spasms.  ? dicyclomine (BENTYL) 10 MG capsule Take 10 mg by mouth 4 (four) times daily -  before meals and at bedtime.  ? diltiazem (TIAZAC) 360 MG 24 hr capsule Take 360 mg by mouth daily.  ? divalproex (DEPAKOTE ER) 500 MG 24 hr tablet Take 500 mg by mouth 2 (two) times daily.  ? docusate sodium (COLACE) 100 MG capsule Take 100 mg by mouth daily.  ? escitalopram (LEXAPRO) 20 MG tablet Take 20 mg by mouth daily.  ? esomeprazole (NEXIUM) 40 MG capsule Take 40 mg by mouth daily.  ? famotidine (PEPCID) 40 MG tablet TAKE 1 TABLET BY MOUTH EVERY DAY  ? FARXIGA 10 MG TABS tablet Take 10 mg  by mouth daily.  ? fluticasone (FLONASE) 50 MCG/ACT nasal spray Place 1 spray into both nostrils daily.  ? glipiZIDE (GLUCOTROL) 5 MG tablet Take 5 mg by mouth 2 (two) times daily.  ? isosorbide mononitrate (IMD

## 2021-07-26 ENCOUNTER — Telehealth: Payer: Self-pay

## 2021-07-26 NOTE — Telephone Encounter (Signed)
-----   Message from Georga Bora sent at 07/26/2021 10:24 AM EDT ----- ?Regarding: FW: Echo ? ?----- Message ----- ?From: Kermit Balo ?Sent: 07/26/2021  10:11 AM EDT ?To: Jenean Lindau, MD, Georga Bora ?Subject: Echo                                          ? ?Good Morning,  ? ?The patient's BCBS policy does not have out of network benefits. The echo will not be covered by carrier. The appeal process on this echo can be started; however I'll need a letter from Dr. Geraldo Pitter justifying the reason on getting the echo performed using an out of network facility verses an in network facility. Please advise if this letter can be furnished to start the appeal process.  ? ?Thank you,  ? ?Alta Corning     ? ? ?

## 2021-07-26 NOTE — Telephone Encounter (Signed)
-----   Message from Jenean Lindau, MD sent at 07/26/2021  1:55 PM EDT ----- ?Regarding: RE: Echo ?He needs to see someone in his network ?----- Message ----- ?From: Truddie Hidden, RN ?Sent: 07/26/2021  11:06 AM EDT ?To: Jenean Lindau, MD ?Subject: FW: Echo                                      ? ?Other than it is time sensitive for preop. ?----- Message ----- ?From: Ellard Artis S ?Sent: 07/26/2021  10:24 AM EDT ?To: Truddie Hidden, RN ?Subject: FW: Echo                                      ? ? ?----- Message ----- ?From: Kermit Balo ?Sent: 07/26/2021  10:11 AM EDT ?To: Jenean Lindau, MD, Georga Bora ?Subject: Echo                                          ? ?Good Morning,  ? ?The patient's BCBS policy does not have out of network benefits. The echo will not be covered by carrier. The appeal process on this echo can be started; however I'll need a letter from Dr. Geraldo Pitter justifying the reason on getting the echo performed using an out of network facility verses an in network facility. Please advise if this letter can be furnished to start the appeal process.  ? ?Thank you,  ? ?Alta Corning     ? ? ? ? ?

## 2021-07-26 NOTE — Telephone Encounter (Signed)
Left VM for pt to callback. Echo will not be covered done here as he is out of network. ?

## 2021-07-26 NOTE — Telephone Encounter (Signed)
Pt has been advised that he is out of network for his insurance to cover the echo. Advised to look at his policy for in-network facility. Pt verbalized understanding and had no additional questions. ?

## 2021-08-10 ENCOUNTER — Telehealth (HOSPITAL_COMMUNITY): Payer: Self-pay

## 2021-08-10 NOTE — Telephone Encounter (Signed)
Detailed instructions left on the the patient's answering machine. Asked to call back with any questions. S.Miriam Liles EMTP

## 2021-08-17 ENCOUNTER — Other Ambulatory Visit: Payer: BLUE CROSS/BLUE SHIELD

## 2022-07-24 ENCOUNTER — Ambulatory Visit: Payer: Medicaid Other | Admitting: Physician Assistant

## 2022-10-03 ENCOUNTER — Ambulatory Visit: Payer: Medicaid Other | Admitting: Physician Assistant

## 2023-04-30 ENCOUNTER — Telehealth: Payer: Self-pay | Admitting: Gastroenterology

## 2023-04-30 NOTE — Telephone Encounter (Signed)
 Inbound call from patient's wife stating patient was at the ED last night due to chest pain. Stated patient was advised to follow up with GI. Patient is currently scheduled for 4/3. Wife is requesting call to discuss options to help sooner. Please advise, thank you.

## 2023-04-30 NOTE — Telephone Encounter (Signed)
 Pt wife requested sooner appointment. Pt was scheduled for 06/19/2023 at 1:30 PM. Pt wife made aware. Pt verbalized understanding with all questions answered.

## 2023-06-19 ENCOUNTER — Ambulatory Visit: Payer: Medicaid Other | Admitting: Gastroenterology

## 2023-06-21 ENCOUNTER — Ambulatory Visit: Payer: Medicaid Other | Admitting: Gastroenterology

## 2023-08-14 ENCOUNTER — Encounter: Payer: Self-pay | Admitting: Physician Assistant

## 2023-08-14 ENCOUNTER — Ambulatory Visit: Admitting: Physician Assistant

## 2023-08-14 NOTE — Progress Notes (Deleted)
 08/14/2023 Dwayne Silva [id number] 11-28-1965  Referring provider: [facility] Primary GI doctor: Dr. Venice Gillis  ASSESSMENT AND PLAN:   GERD with history of esophageal stricture Status post cholecystectomy 05/2016 05/17/2020 EGD Dr. Venice Gillis for dysphagia no strictures seen had empiric dilation gastritis normal duodenum, negative H. pylori negative EOE -alginate therapy given -Lifestyle changes discussed, avoid NSAIDS, ETOH, hand out given to the patient -Weight loss discussed with the patient -Smoking cessation discussed in detail -Schedule EGD at [facility] to evaluate GERd, esophagitis, hiatal hernia,I discussed risks of EGD with patient today, including risk of sedation, bleeding or perforation. Patient provides understanding and gave verbal consent to proceed. - consider RUQ US  and HIDA pending results -Consider GES, gastroparesis diet given  IBS mixed with bloating and lactose intolerance  Personal history of TA polyps 02/2018 colonoscopy colonic polyps status post polypectomy TA polyps recall 3 years minimal sigmoid diverticulosis, internal hemorrhoids previous anorectal surgery normal colonoscopy to cecum Recall colonoscopy 02/2021  Atrial fibrillation On Xarelto Mild nonobstructive plaque catheterization 11/2019 Lexi scan 04/30/2023 no reversible ischemia or infarction normal LV wall motion  Fatty liver with associated type 2 diabetes and hyperlipidemia    Latest Ref Rng & Units 06/23/2021    7:47 PM 06/07/2013    6:48 PM  Hepatic Function  Total Protein 6.5 - 8.1 g/dL 7.3  7.0   Albumin 3.5 - 5.0 g/dL 3.8  3.6   AST 15 - 41 U/L 25  35   ALT 0 - 44 U/L 29  35   Alk Phosphatase 38 - 126 U/L 79  71   Total Bilirubin 0.3 - 1.2 mg/dL <5.6  0.3    Platelets 214  INR 06/23/2021 0.9  FIB 4 *** Patients less than 1.45% are low risk, counsel and monitor.  Patients above 3.25% need referral for evaluation.  Patients between 1.45 and 3.25 can get fibroscan/evaluate.  -  repeat imaging with US  elastography - need LFTs and CBC monitored every 6 months, - evaluation with imaging every 2-3 years.  -Continue to work on risk factor modification including diet exercise and control of risk factors including blood sugars. - cessation of alcohol   Patient Care Team: [facility] as PCP - General  HISTORY OF PRESENT ILLNESS: 58 y.o. male with a past medical history listed below presents for evaluation of ***.   *** Discussed the use of AI scribe software for clinical note transcription with the patient, who gave verbal consent to proceed.  History of Present Illness            He  reports that he has quit smoking. He has been exposed to tobacco smoke. He has never used smokeless tobacco. He reports that he does not currently use alcohol. He reports that he does not use drugs.  RELEVANT GI HISTORY, IMAGING AND LABS: Results          CBC    Component Value Date/Time   WBC 10.0 06/23/2021 1947   RBC 4.99 06/23/2021 1947   HGB 16.0 06/23/2021 1955   HCT 47.0 06/23/2021 1955   PLT 214 06/23/2021 1947   MCV 88.2 06/23/2021 1947   MCH 29.7 06/23/2021 1947   MCHC 33.6 06/23/2021 1947   RDW 14.8 06/23/2021 1947   LYMPHSABS 2.5 06/23/2021 1947   MONOABS 0.7 06/23/2021 1947   EOSABS 0.1 06/23/2021 1947   BASOSABS 0.1 06/23/2021 1947   No results for input(s): "HGB" in the last 8760 hours.  CMP  Component Value Date/Time   NA 138 06/23/2021 1955   K 4.4 06/23/2021 1955   CL 101 06/23/2021 1955   CO2 23 06/23/2021 1947   GLUCOSE 240 (H) 06/23/2021 1955   BUN 8 06/23/2021 1955   CREATININE 0.70 06/23/2021 1955   CALCIUM  9.2 06/23/2021 1947   PROT 7.3 06/23/2021 1947   ALBUMIN 3.8 06/23/2021 1947   AST 25 06/23/2021 1947   ALT 29 06/23/2021 1947   ALKPHOS 79 06/23/2021 1947   BILITOT <0.1 (L) 06/23/2021 1947   GFRNONAA >60 06/23/2021 1947   GFRAA >90 06/09/2013 0250      Latest Ref Rng & Units 06/23/2021    7:47 PM  06/07/2013    6:48 PM  Hepatic Function  Total Protein 6.5 - 8.1 g/dL 7.3  7.0   Albumin 3.5 - 5.0 g/dL 3.8  3.6   AST 15 - 41 U/L 25  35   ALT 0 - 44 U/L 29  35   Alk Phosphatase 38 - 126 U/L 79  71   Total Bilirubin 0.3 - 1.2 mg/dL <5.1  0.3       Current Medications:   Current Outpatient Medications (Endocrine & Metabolic):    FARXIGA 10 MG TABS tablet, Take 10 mg by mouth daily.   glipiZIDE (GLUCOTROL) 5 MG tablet, Take 5 mg by mouth 2 (two) times daily.   JARDIANCE 25 MG TABS tablet, Take 25 mg by mouth daily.   metFORMIN (GLUCOPHAGE) 1000 MG tablet, Take 1,000 mg by mouth 2 (two) times daily.   Current Outpatient Medications (Cardiovascular):    diltiazem  (TIAZAC ) 360 MG 24 hr capsule, Take 360 mg by mouth daily.   isosorbide mononitrate (IMDUR) 30 MG 24 hr tablet, Take 30 mg by mouth daily.   nitroGLYCERIN  (NITROSTAT ) 0.4 MG SL tablet, Place 1 tablet (0.4 mg total) under the tongue every 5 (five) minutes as needed for chest pain.   pravastatin  (PRAVACHOL ) 80 MG tablet, Take 80 mg by mouth at bedtime.   sildenafil (VIAGRA) 50 MG tablet, Take 50 mg by mouth daily as needed for erectile dysfunction.   Current Outpatient Medications (Respiratory):    albuterol (VENTOLIN HFA) 108 (90 Base) MCG/ACT inhaler, Inhale 2 puffs into the lungs every 4 (four) hours as needed for wheezing or shortness of breath.   cetirizine (ZYRTEC) 10 MG tablet, Take 10 mg by mouth at bedtime.   fluticasone (FLONASE) 50 MCG/ACT nasal spray, Place 1 spray into both nostrils daily.   montelukast (SINGULAIR) 10 MG tablet, Take 10 mg by mouth at bedtime.   promethazine (PHENERGAN) 12.5 MG tablet, Take 12.5 mg by mouth 2 (two) times daily as needed for nausea or vomiting.   Current Outpatient Medications (Analgesics):    oxyCODONE-acetaminophen  (PERCOCET) 10-325 MG tablet, Take 1 tablet by mouth every 6 (six) hours as needed for pain.     Current Outpatient Medications (Other):    amitriptyline  (ELAVIL) 25 MG tablet, Take 100 mg by mouth at bedtime.   cyclobenzaprine (FLEXERIL) 10 MG tablet, Take 10 mg by mouth 3 (three) times daily as needed for muscle spasms.   dicyclomine (BENTYL) 10 MG capsule, Take 10 mg by mouth 4 (four) times daily -  before meals and at bedtime.   divalproex (DEPAKOTE ER) 500 MG 24 hr tablet, Take 500 mg by mouth 2 (two) times daily.   docusate sodium (COLACE) 100 MG capsule, Take 100 mg by mouth daily.   escitalopram  (LEXAPRO ) 20 MG tablet, Take 20 mg by mouth  daily.   esomeprazole (NEXIUM) 40 MG capsule, Take 40 mg by mouth daily.   famotidine  (PEPCID ) 40 MG tablet, TAKE 1 TABLET BY MOUTH EVERY DAY   Magnesium 400 MG TABS, Take 400 mg by mouth daily.   meclizine (ANTIVERT) 25 MG tablet, Take 25 mg by mouth 3 (three) times daily as needed for dizziness.   meclizine (ANTIVERT) 25 MG tablet, Take 25 mg by mouth 3 (three) times daily as needed for dizziness.   NARCAN 4 MG/0.1ML LIQD nasal spray kit, Place 1 spray into the nose as needed (opioid overdose).   pregabalin (LYRICA) 100 MG capsule, Take 100 mg by mouth 4 (four) times daily.   prochlorperazine (COMPAZINE) 5 MG tablet, Take 5 mg by mouth daily as needed for nausea/vomiting.   tamsulosin (FLOMAX) 0.4 MG CAPS capsule, Take 0.4 mg by mouth daily.   Vitamin D, Ergocalciferol, (DRISDOL) 1.25 MG (50000 UNIT) CAPS capsule, Take 50,000 Units by mouth every 7 (seven) days.  Current Facility-Administered Medications (Other):    dextrose  5 % solution  Medical History:  Past Medical History:  Diagnosis Date   Abdominal pain, LLQ (left lower quadrant)    Abnormal nuclear stress test 10/23/2019   Acute bilateral ankle pain 09/05/2018   Angina pectoris (HCC) 10/23/2019   Anxiety    Arthritis of left knee 06/22/2021   Formatting of this note might be different from the original. Added automatically from request for surgery [id number]   Atrial fibrillation (HCC)    a. New onset 01/2013, spontaneously converted to  sinus during hosp eval at [facility].   Benign prostatic hyperplasia with weak urinary stream 10/24/2018   Chest pain    a. Neg cath at [facility] 07/2007. b. Neg nuc at [facility] 2010. c. LHC 2012 at [facility] - mod, diffuse coronary vasospasm of distal RCA and RPL, mild nonobst CAD (40% Cx), normal EF. d. Normal nuc at [facility] 01/2013. Echo 01/2013: EF >55, normal LV, normal diastolic function, no pulm HTN, RV normal, no valvular abnormalities, neg CTA for PE.   Chronic bilateral low back pain without sciatica 06/23/2015   Chronic insomnia    Chronic left shoulder pain 04/18/2019   Chronic pain of both shoulders 06/15/2016   Chronic pain of left knee 06/15/2016   Coronary artery disease 09/25/2020   Coronary vasospasm (HCC)    a. Suggested by LHC at [facility] in 2012.   Cyst of sphenoid sinus 05/19/2015   Depressed 07/15/2017   Diabetes mellitus (HCC) 04/21/2020   Diabetes mellitus due to underlying condition with unspecified complications (HCC) 02/22/2016   Dyslipidemia 02/04/2015   Dyspepsia    Essential hypertension 02/04/2015   Fatty liver    Flank pain 08/08/2018   Gastro-esophageal reflux disease with esophagitis 07/15/2017   GERD (gastroesophageal reflux disease)    H/O subdural hemorrhage 10/31/2020   High cholesterol 07/15/2017   Hypercholesterolemia    Hyperlipidemia    Hypertriglyceridemia    IBS (irritable bowel syndrome)    Neck pain 06/23/2015   Noncompliance with diabetes treatment 10/26/2020   Obesity    Oropharyngeal dysphagia    Paroxysmal atrial fibrillation (HCC) 02/04/2015   Pre-diabetes    Psychophysiological insomnia 10/24/2018   S/P left knee arthroscopy 03/01/2017   Sphenoidal sinus polyp 05/19/2015   Spondylosis of cervical region without myelopathy or radiculopathy 05/19/2015   Sprain and strain of deltoid (ligament) of ankle 09/05/2018   Subdural bleeding (HCC) 10/31/2020   Testicular hypofunction    Therapeutic opioid induced constipation 12/21/2019   Traumatic  subdural hemorrhage with loss of consciousness status unknown, initial encounter (HCC) 09/25/2020   Type 2 diabetes mellitus with hyperglycemia (HCC)    Type 2 diabetes mellitus without complications (HCC) 02/04/2015   Uncontrolled type 2 diabetes mellitus    Vasospastic angina (HCC)    Vitamin D deficiency    Allergies:  Allergies  Allergen Reactions   Iodinated Contrast Media    Penicillin G Nausea And Vomiting   Iodine Rash     Surgical History:  He  has a past surgical history that includes Hemorroidectomy (04/24/2006); Appendectomy (1985); Colonoscopy (07/2005); Colonoscopy (07/2001); Upper gi endoscopy (03/2004); Upper gi endoscopy (04/07/2016); Cholecystectomy (05/2017); Cardiac catheterization (2017); Knee surgery (Left); Brain surgery (09/2020); Knee surgery (Right, 2010); External ear surgery (Left, 1990); and Cardiac catheterization (01/2020). Family History:  His family history includes Arthritis in his maternal grandmother and mother; Asthma in his maternal grandmother; CAD in his brother and mother; Diabetes in his mother; Heart disease in his maternal grandfather; Hepatitis C in his father.  REVIEW OF SYSTEMS  : All other systems reviewed and negative except where noted in the History of Present Illness.  PHYSICAL EXAM: There were no vitals taken for this visit. Physical Exam          Edmonia Gottron, PA-C 7:47 AM

## 2023-09-28 NOTE — Progress Notes (Deleted)
 09/28/2023 Dwayne Silva [id number] 09-Jun-1965  Referring provider: [facility] Primary GI doctor: Dr. Charlanne  ASSESSMENT AND PLAN:  GERD Noncardiac chest pain 05/17/2020 EGD for dysphagia esophagus empirically dilated, gastritis normal duodenum negative EOE, negative H. pylori, positive chronic gastritis with intestinal metaplasia  Personal history of colon polyps 02/21/2018 colonoscopy adequate prep diverticulosis small nonbleeding internal hemorrhoids 8 mm TA polyp distal ascending colon 4 mm TA polyp sigmoid colon recall 3 years  Paroxysmal atrial fibrillation 08/21/2023 echo ejection fraction 60 to 65% no significant valvular stenosis or regurg  CAD Nonobstructive cath 2021 04/2023 nuclear stress test no reversible ischemia low risk  Fatty liver /15/2025 AST 10, ALT 10, alk phos 63 total bilirubin 0.2  Type 2 diabetes   Patient Care Team: [facility] And as PCP - General  HISTORY OF PRESENT ILLNESS: 58 y.o. male with a past medical history listed below presents for evaluation of GERD.   Screening  *** Discussed the use of AI scribe software for clinical note transcription with the patient, who gave verbal consent to proceed.  History of Present Illness            He  reports that he has quit smoking. He has been exposed to tobacco smoke. He has never used smokeless tobacco. He reports that he does not currently use alcohol. He reports that he does not use drugs.  RELEVANT GI HISTORY, IMAGING AND LABS: Results          CBC    Component Value Date/Time   WBC 10.0 06/23/2021 1947   RBC 4.99 06/23/2021 1947   HGB 16.0 06/23/2021 1955   HCT 47.0 06/23/2021 1955   PLT 214 06/23/2021 1947   MCV 88.2 06/23/2021 1947   MCH 29.7 06/23/2021 1947   MCHC 33.6 06/23/2021 1947   RDW 14.8 06/23/2021 1947   LYMPHSABS 2.5 06/23/2021 1947   MONOABS 0.7 06/23/2021 1947   EOSABS 0.1 06/23/2021 1947   BASOSABS 0.1 06/23/2021 1947    No results for input(s): HGB in the last 8760 hours.  CMP     Component Value Date/Time   NA 138 06/23/2021 1955   K 4.4 06/23/2021 1955   CL 101 06/23/2021 1955   CO2 23 06/23/2021 1947   GLUCOSE 240 (H) 06/23/2021 1955   BUN 8 06/23/2021 1955   CREATININE 0.70 06/23/2021 1955   CALCIUM  9.2 06/23/2021 1947   PROT 7.3 06/23/2021 1947   ALBUMIN 3.8 06/23/2021 1947   AST 25 06/23/2021 1947   ALT 29 06/23/2021 1947   ALKPHOS 79 06/23/2021 1947   BILITOT <0.1 (L) 06/23/2021 1947   GFRNONAA >60 06/23/2021 1947   GFRAA >90 06/09/2013 0250      Latest Ref Rng & Units 06/23/2021    7:47 PM 06/07/2013    6:48 PM  Hepatic Function  Total Protein 6.5 - 8.1 g/dL 7.3  7.0   Albumin 3.5 - 5.0 g/dL 3.8  3.6   AST 15 - 41 U/L 25  35   ALT 0 - 44 U/L 29  35   Alk Phosphatase 38 - 126 U/L 79  71   Total Bilirubin 0.3 - 1.2 mg/dL <9.8  0.3       Current Medications:   Current Outpatient Medications (Endocrine & Metabolic):    FARXIGA 10 MG TABS tablet, Take 10 mg by mouth daily.   glipiZIDE (GLUCOTROL) 5 MG tablet, Take 5 mg by mouth 2 (two) times daily.  JARDIANCE 25 MG TABS tablet, Take 25 mg by mouth daily.   metFORMIN (GLUCOPHAGE) 1000 MG tablet, Take 1,000 mg by mouth 2 (two) times daily.   Current Outpatient Medications (Cardiovascular):    diltiazem  (TIAZAC ) 360 MG 24 hr capsule, Take 360 mg by mouth daily.   isosorbide mononitrate (IMDUR) 30 MG 24 hr tablet, Take 30 mg by mouth daily.   nitroGLYCERIN  (NITROSTAT ) 0.4 MG SL tablet, Place 1 tablet (0.4 mg total) under the tongue every 5 (five) minutes as needed for chest pain.   pravastatin  (PRAVACHOL ) 80 MG tablet, Take 80 mg by mouth at bedtime.   sildenafil (VIAGRA) 50 MG tablet, Take 50 mg by mouth daily as needed for erectile dysfunction.   Current Outpatient Medications (Respiratory):    albuterol (VENTOLIN HFA) 108 (90 Base) MCG/ACT inhaler, Inhale 2 puffs into the lungs every 4 (four) hours as needed for wheezing  or shortness of breath.   cetirizine (ZYRTEC) 10 MG tablet, Take 10 mg by mouth at bedtime.   fluticasone (FLONASE) 50 MCG/ACT nasal spray, Place 1 spray into both nostrils daily.   montelukast (SINGULAIR) 10 MG tablet, Take 10 mg by mouth at bedtime.   promethazine (PHENERGAN) 12.5 MG tablet, Take 12.5 mg by mouth 2 (two) times daily as needed for nausea or vomiting.   Current Outpatient Medications (Analgesics):    oxyCODONE-acetaminophen  (PERCOCET) 10-325 MG tablet, Take 1 tablet by mouth every 6 (six) hours as needed for pain.     Current Outpatient Medications (Other):    amitriptyline (ELAVIL) 25 MG tablet, Take 100 mg by mouth at bedtime.   cyclobenzaprine (FLEXERIL) 10 MG tablet, Take 10 mg by mouth 3 (three) times daily as needed for muscle spasms.   dicyclomine (BENTYL) 10 MG capsule, Take 10 mg by mouth 4 (four) times daily -  before meals and at bedtime.   divalproex (DEPAKOTE ER) 500 MG 24 hr tablet, Take 500 mg by mouth 2 (two) times daily.   docusate sodium (COLACE) 100 MG capsule, Take 100 mg by mouth daily.   escitalopram  (LEXAPRO ) 20 MG tablet, Take 20 mg by mouth daily.   esomeprazole (NEXIUM) 40 MG capsule, Take 40 mg by mouth daily.   famotidine  (PEPCID ) 40 MG tablet, TAKE 1 TABLET BY MOUTH EVERY DAY   Magnesium 400 MG TABS, Take 400 mg by mouth daily.   meclizine (ANTIVERT) 25 MG tablet, Take 25 mg by mouth 3 (three) times daily as needed for dizziness.   meclizine (ANTIVERT) 25 MG tablet, Take 25 mg by mouth 3 (three) times daily as needed for dizziness.   NARCAN 4 MG/0.1ML LIQD nasal spray kit, Place 1 spray into the nose as needed (opioid overdose).   pregabalin (LYRICA) 100 MG capsule, Take 100 mg by mouth 4 (four) times daily.   prochlorperazine (COMPAZINE) 5 MG tablet, Take 5 mg by mouth daily as needed for nausea/vomiting.   tamsulosin (FLOMAX) 0.4 MG CAPS capsule, Take 0.4 mg by mouth daily.   Vitamin D, Ergocalciferol, (DRISDOL) 1.25 MG (50000 UNIT) CAPS  capsule, Take 50,000 Units by mouth every 7 (seven) days.  Current Facility-Administered Medications (Other):    dextrose  5 % solution  Medical History:  Past Medical History:  Diagnosis Date   Abdominal pain, LLQ (left lower quadrant)    Abnormal nuclear stress test 10/23/2019   Acute bilateral ankle pain 09/05/2018   Angina pectoris (HCC) 10/23/2019   Anxiety    Arthritis of left knee 06/22/2021   Formatting of this note might  be different from the original. Added automatically from request for surgery [id number]   Atrial fibrillation (HCC)    a. New onset 01/2013, spontaneously converted to sinus during hosp eval at [facility].   Benign prostatic hyperplasia with weak urinary stream 10/24/2018   Chest pain    a. Neg cath at [facility] 07/2007. b. Neg nuc at [facility] 2010. c. LHC 2012 at [facility] - mod, diffuse coronary vasospasm of distal RCA and RPL, mild nonobst CAD (40% Cx), normal EF. d. Normal nuc at [facility] 01/2013. Echo 01/2013: EF >55, normal LV, normal diastolic function, no pulm HTN, RV normal, no valvular abnormalities, neg CTA for PE.   Chronic bilateral low back pain without sciatica 06/23/2015   Chronic insomnia    Chronic left shoulder pain 04/18/2019   Chronic pain of both shoulders 06/15/2016   Chronic pain of left knee 06/15/2016   Coronary artery disease 09/25/2020   Coronary vasospasm (HCC)    a. Suggested by LHC at [facility] in 2012.   Cyst of sphenoid sinus 05/19/2015   Depressed 07/15/2017   Diabetes mellitus (HCC) 04/21/2020   Diabetes mellitus due to underlying condition with unspecified complications (HCC) 02/22/2016   Dyslipidemia 02/04/2015   Dyspepsia    Essential hypertension 02/04/2015   Fatty liver    Flank pain 08/08/2018   Gastro-esophageal reflux disease with esophagitis 07/15/2017   GERD (gastroesophageal reflux disease)    H/O subdural hemorrhage 10/31/2020   High cholesterol 07/15/2017   Hypercholesterolemia    Hyperlipidemia    Hypertriglyceridemia     IBS (irritable bowel syndrome)    Neck pain 06/23/2015   Noncompliance with diabetes treatment 10/26/2020   Obesity    Oropharyngeal dysphagia    Paroxysmal atrial fibrillation (HCC) 02/04/2015   Pre-diabetes    Psychophysiological insomnia 10/24/2018   S/P left knee arthroscopy 03/01/2017   Sphenoidal sinus polyp 05/19/2015   Spondylosis of cervical region without myelopathy or radiculopathy 05/19/2015   Sprain and strain of deltoid (ligament) of ankle 09/05/2018   Subdural bleeding (HCC) 10/31/2020   Testicular hypofunction    Therapeutic opioid induced constipation 12/21/2019   Traumatic subdural hemorrhage with loss of consciousness status unknown, initial encounter (HCC) 09/25/2020   Type 2 diabetes mellitus with hyperglycemia (HCC)    Type 2 diabetes mellitus without complications (HCC) 02/04/2015   Uncontrolled type 2 diabetes mellitus    Vasospastic angina (HCC)    Vitamin D deficiency    Allergies:  Allergies  Allergen Reactions   Iodinated Contrast Media    Penicillin G Nausea And Vomiting   Iodine Rash     Surgical History:  He  has a past surgical history that includes Hemorroidectomy (04/24/2006); Appendectomy (1985); Colonoscopy (07/2005); Colonoscopy (07/2001); Upper gi endoscopy (03/2004); Upper gi endoscopy (04/07/2016); Cholecystectomy (05/2017); Cardiac catheterization (2017); Knee surgery (Left); Brain surgery (09/2020); Knee surgery (Right, 2010); External ear surgery (Left, 1990); and Cardiac catheterization (01/2020). Family History:  His family history includes Arthritis in his maternal grandmother and mother; Asthma in his maternal grandmother; CAD in his brother and mother; Diabetes in his mother; Heart disease in his maternal grandfather; Hepatitis C in his father.  REVIEW OF SYSTEMS  : All other systems reviewed and negative except where noted in the History of Present Illness.  PHYSICAL EXAM: There were no vitals taken for this visit. Physical Exam           Alan JONELLE Coombs, PA-C 1:25 PM

## 2023-10-02 ENCOUNTER — Ambulatory Visit: Admitting: Physician Assistant

## 2023-10-31 IMAGING — CT CT HEAD W/O CM
3 of 4 series · 13 of 47 positions shown, 15 images · non-contrast
Comparison: 12/10/2020

CLINICAL DATA: Left-sided headaches for several days, initial
encounter



[Series 3: head without ax · axial · non-contrast · 0.34mm/px · z∈[-130,-13]mm · 7 of 34 slices shown, 9 images]
[im 5/34  brain]
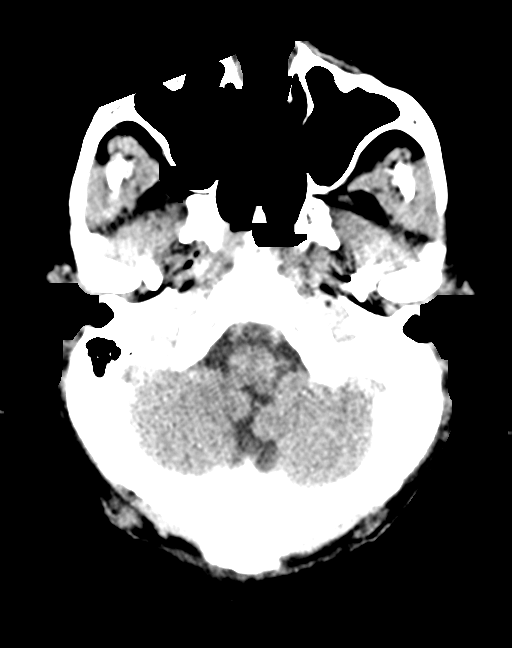
[im 5/34  bone]
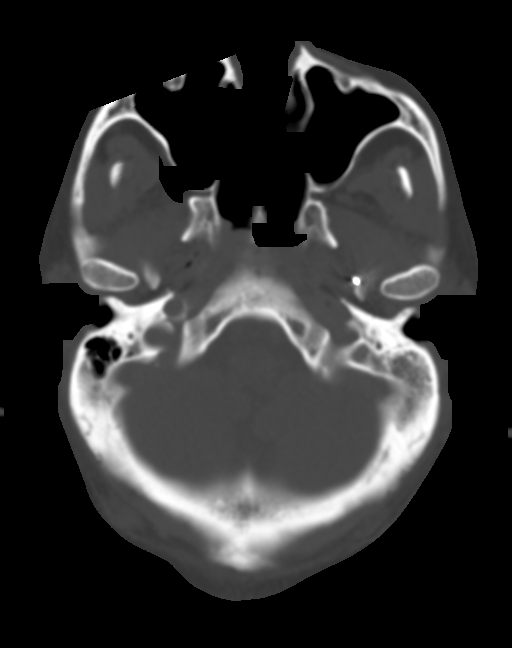
[im 9/34  brain]
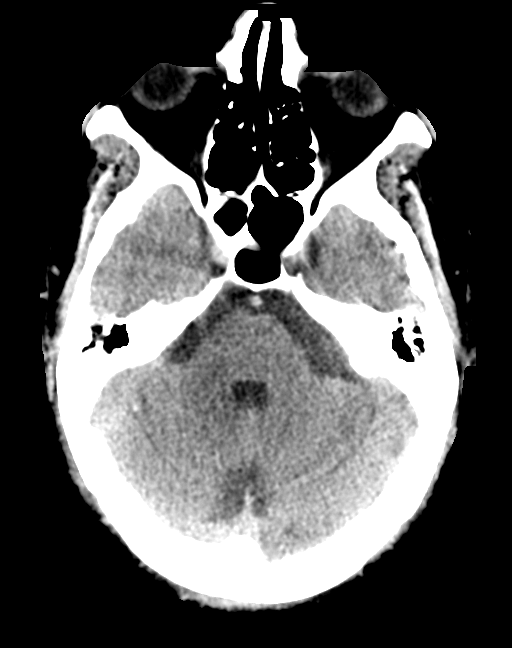
[im 13/34  brain]
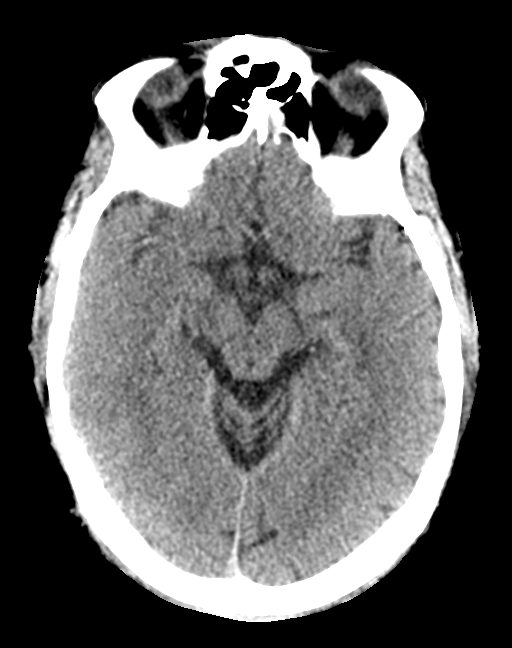
[im 17/34  brain]
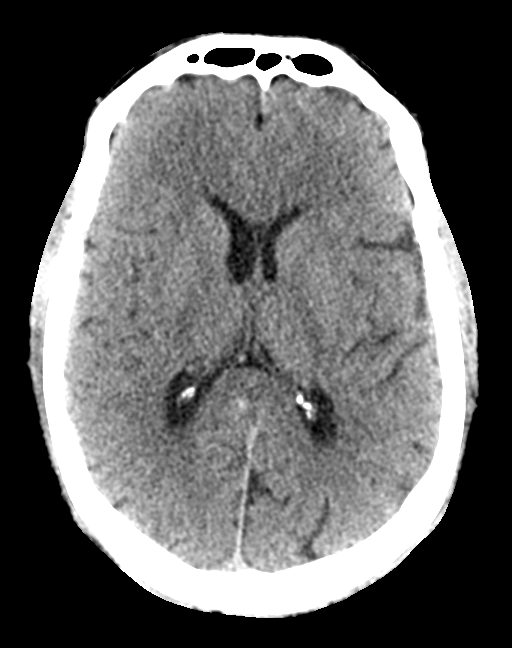
[im 21/34  brain]
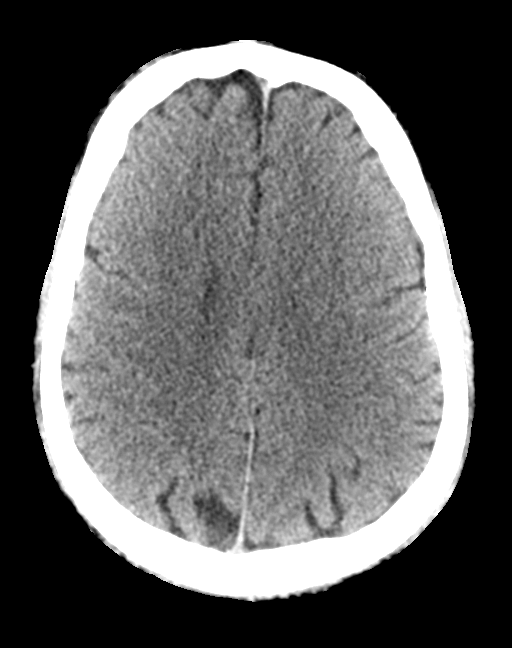
[im 21/34  bone]
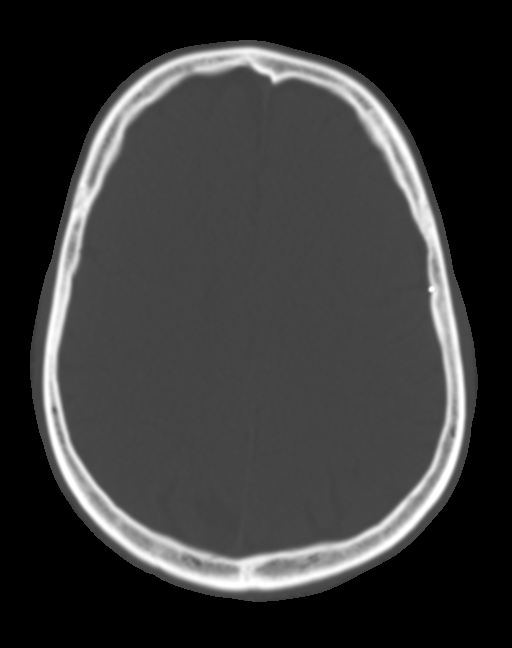
[im 25/34  brain]
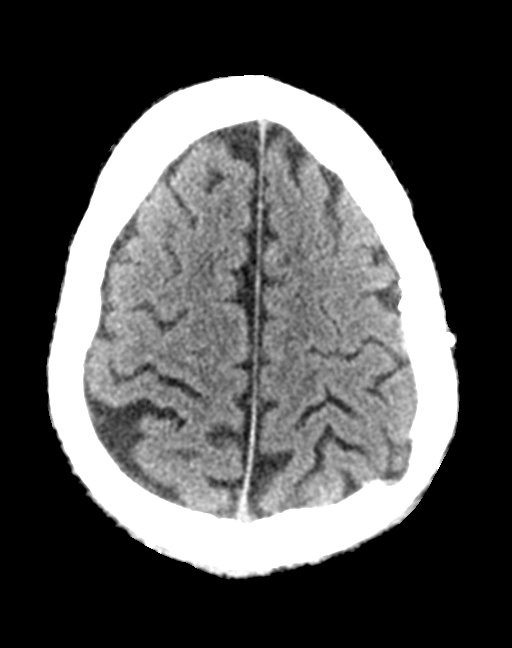
[im 29/34  brain]
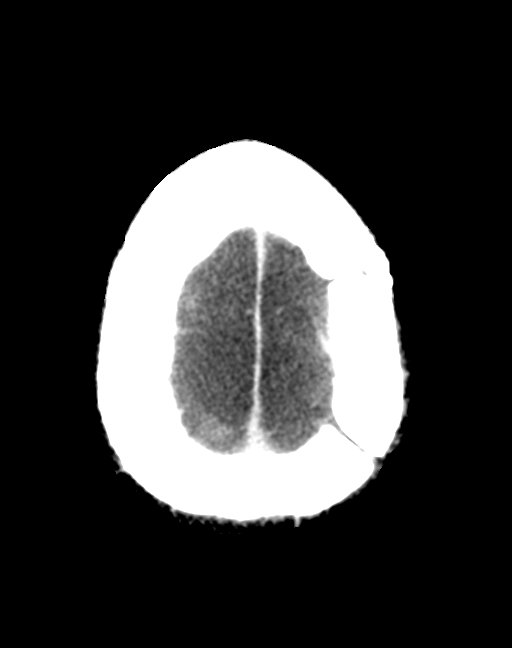

[Series 5: head without cor · coronal · non-contrast · 0.33mm/px · 3 of 74 slices shown]
[im 25/74  brain]
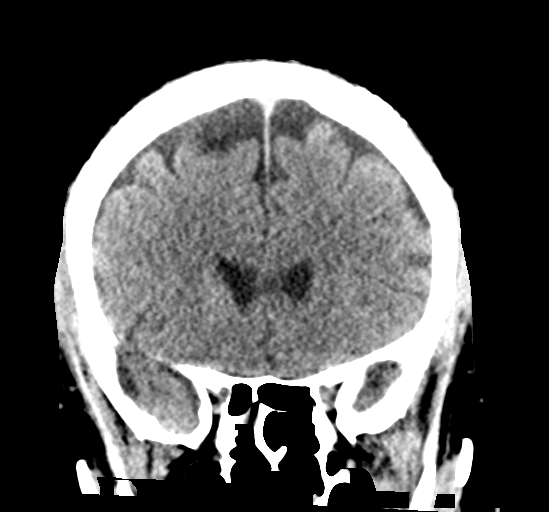
[im 33/74  brain]
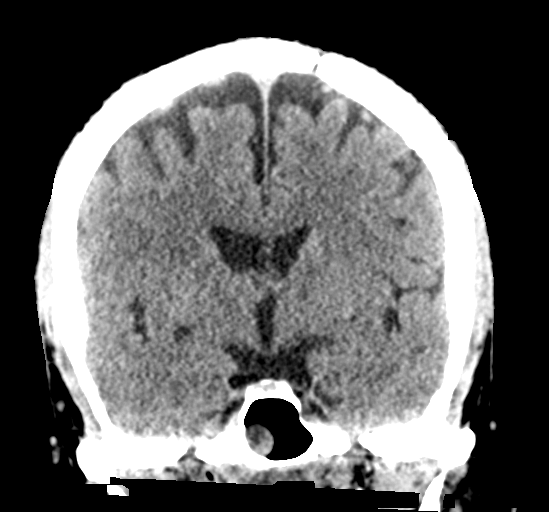
[im 41/74  brain]
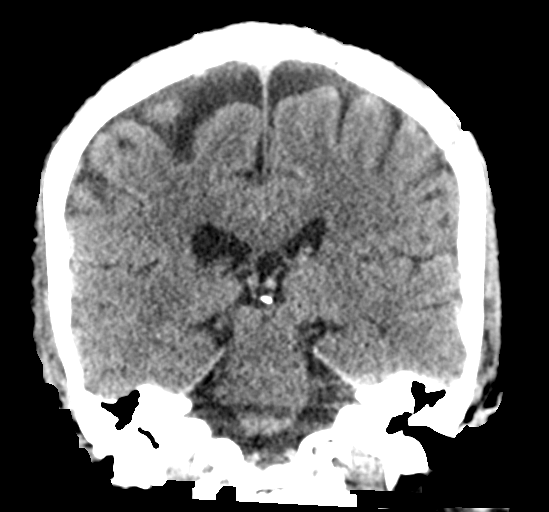

[Series 6: head without sag · sagittal · non-contrast · 0.33mm/px · 3 of 62 slices shown]
[im 21/62  brain]
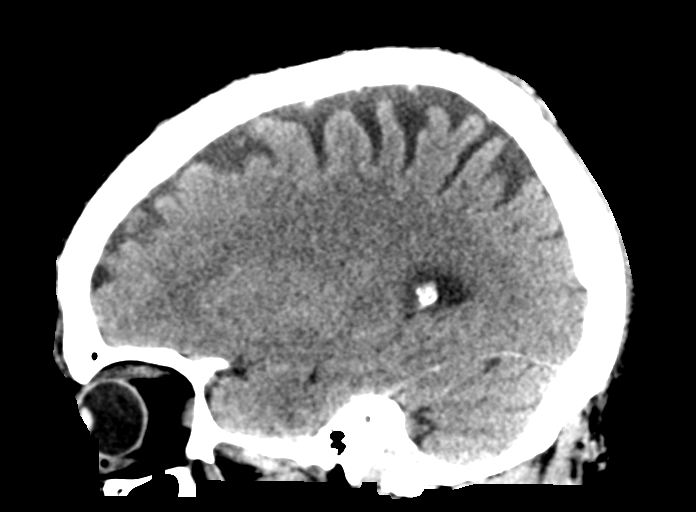
[im 31/62  brain]
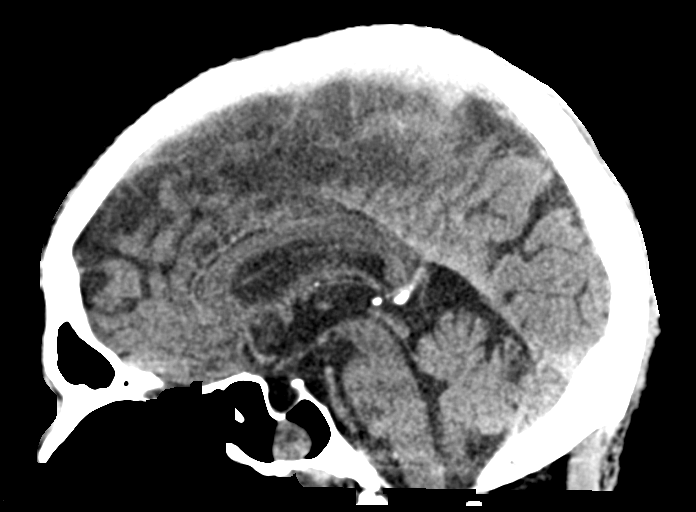
[im 41/62  brain]
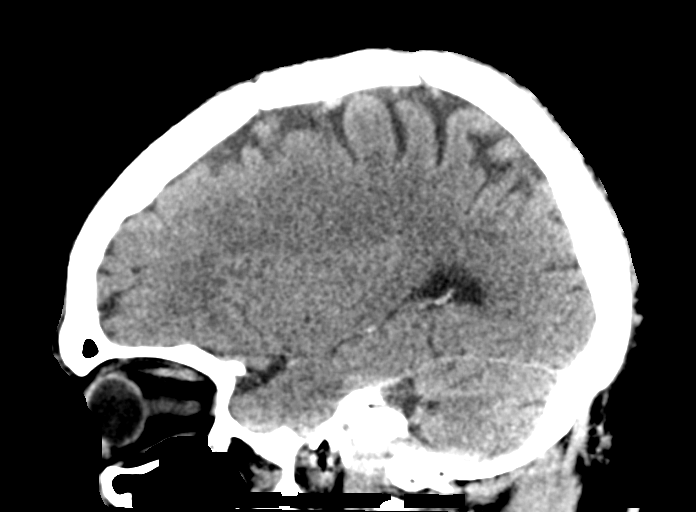

[13 of 47 positions shown; findings below may reference images not displayed]

FINDINGS: Brain: Mild atrophic changes are noted. No findings to suggest acute
hemorrhage, acute infarction or space-occupying mass lesion are
noted.

Vascular: No hyperdense vessel or unexpected calcification.

Skull: Changes of prior craniotomy on the left similar to that seen
on the prior exam.

Sinuses/Orbits: No acute finding.

Other: High density material within the venous structures along the
calvarium on the left stable in appearance from the prior exam
related to prior surgery.
IMPRESSION: No acute abnormality noted.

Postsurgical changes as described.
